# Patient Record
Sex: Female | Born: 1985 | Race: White | Hispanic: No | Marital: Single | Smoking: Current some day smoker
Health system: Southern US, Community
[De-identification: ages and names within clinical notes are randomized; demographics above are authoritative.]

## PROBLEM LIST (undated history)

## (undated) DIAGNOSIS — T7421XA Adult sexual abuse, confirmed, initial encounter: Secondary | ICD-10-CM

## (undated) DIAGNOSIS — K589 Irritable bowel syndrome without diarrhea: Secondary | ICD-10-CM

## (undated) DIAGNOSIS — R87619 Unspecified abnormal cytological findings in specimens from cervix uteri: Secondary | ICD-10-CM

## (undated) DIAGNOSIS — N750 Cyst of Bartholin's gland: Secondary | ICD-10-CM

## (undated) DIAGNOSIS — IMO0002 Reserved for concepts with insufficient information to code with codable children: Secondary | ICD-10-CM

## (undated) DIAGNOSIS — B977 Papillomavirus as the cause of diseases classified elsewhere: Secondary | ICD-10-CM

## (undated) DIAGNOSIS — D649 Anemia, unspecified: Secondary | ICD-10-CM

## (undated) DIAGNOSIS — R51 Headache: Secondary | ICD-10-CM

## (undated) DIAGNOSIS — F909 Attention-deficit hyperactivity disorder, unspecified type: Secondary | ICD-10-CM

## (undated) DIAGNOSIS — F419 Anxiety disorder, unspecified: Secondary | ICD-10-CM

## (undated) DIAGNOSIS — R102 Pelvic and perineal pain: Secondary | ICD-10-CM

## (undated) HISTORY — PX: ABDOMINAL HYSTERECTOMY: SHX81

## (undated) HISTORY — DX: Cyst of Bartholin's gland: N75.0

## (undated) HISTORY — PX: WISDOM TOOTH EXTRACTION: SHX21

## (undated) HISTORY — PX: TONSILLECTOMY: SUR1361

## (undated) HISTORY — DX: Pelvic and perineal pain: R10.2

---
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
## 2003-01-02 ENCOUNTER — Other Ambulatory Visit: Admission: RE | Admit: 2003-01-02 | Discharge: 2003-01-02 | Payer: Self-pay | Admitting: Obstetrics and Gynecology

## 2003-02-15 ENCOUNTER — Emergency Department (HOSPITAL_COMMUNITY): Admission: EM | Admit: 2003-02-15 | Discharge: 2003-02-15 | Payer: Self-pay | Admitting: Emergency Medicine

## 2003-02-15 ENCOUNTER — Encounter: Payer: Self-pay | Admitting: Emergency Medicine

## 2004-02-16 ENCOUNTER — Inpatient Hospital Stay (HOSPITAL_COMMUNITY): Admission: AD | Admit: 2004-02-16 | Discharge: 2004-02-16 | Payer: Self-pay | Admitting: *Deleted

## 2004-02-27 ENCOUNTER — Inpatient Hospital Stay (HOSPITAL_COMMUNITY): Admission: AD | Admit: 2004-02-27 | Discharge: 2004-02-27 | Payer: Self-pay | Admitting: Obstetrics

## 2004-03-10 ENCOUNTER — Inpatient Hospital Stay (HOSPITAL_COMMUNITY): Admission: AD | Admit: 2004-03-10 | Discharge: 2004-03-10 | Payer: Self-pay | Admitting: Obstetrics & Gynecology

## 2004-03-16 ENCOUNTER — Inpatient Hospital Stay (HOSPITAL_COMMUNITY): Admission: AD | Admit: 2004-03-16 | Discharge: 2004-03-19 | Payer: Self-pay | Admitting: Obstetrics & Gynecology

## 2004-07-28 ENCOUNTER — Emergency Department (HOSPITAL_COMMUNITY): Admission: EM | Admit: 2004-07-28 | Discharge: 2004-07-28 | Payer: Self-pay | Admitting: Emergency Medicine

## 2005-04-15 ENCOUNTER — Emergency Department (HOSPITAL_COMMUNITY): Admission: EM | Admit: 2005-04-15 | Discharge: 2005-04-15 | Payer: Self-pay | Admitting: Emergency Medicine

## 2005-06-06 ENCOUNTER — Emergency Department (HOSPITAL_COMMUNITY): Admission: EM | Admit: 2005-06-06 | Discharge: 2005-06-06 | Payer: Self-pay | Admitting: Emergency Medicine

## 2005-11-28 HISTORY — PX: TUBAL LIGATION: SHX77

## 2006-02-15 ENCOUNTER — Ambulatory Visit (HOSPITAL_COMMUNITY): Admission: RE | Admit: 2006-02-15 | Discharge: 2006-02-15 | Payer: Self-pay | Admitting: Obstetrics & Gynecology

## 2006-03-21 ENCOUNTER — Ambulatory Visit (HOSPITAL_COMMUNITY): Admission: RE | Admit: 2006-03-21 | Discharge: 2006-03-21 | Payer: Self-pay | Admitting: Obstetrics & Gynecology

## 2006-05-18 ENCOUNTER — Ambulatory Visit (HOSPITAL_COMMUNITY): Admission: RE | Admit: 2006-05-18 | Discharge: 2006-05-18 | Payer: Self-pay | Admitting: Obstetrics & Gynecology

## 2006-06-09 ENCOUNTER — Inpatient Hospital Stay (HOSPITAL_COMMUNITY): Admission: AD | Admit: 2006-06-09 | Discharge: 2006-06-09 | Payer: Self-pay | Admitting: Obstetrics & Gynecology

## 2006-06-10 ENCOUNTER — Inpatient Hospital Stay (HOSPITAL_COMMUNITY): Admission: AD | Admit: 2006-06-10 | Discharge: 2006-06-10 | Payer: Self-pay | Admitting: Obstetrics & Gynecology

## 2006-07-21 ENCOUNTER — Inpatient Hospital Stay (HOSPITAL_COMMUNITY): Admission: AD | Admit: 2006-07-21 | Discharge: 2006-07-21 | Payer: Self-pay | Admitting: Obstetrics & Gynecology

## 2006-07-24 ENCOUNTER — Ambulatory Visit (HOSPITAL_COMMUNITY): Admission: RE | Admit: 2006-07-24 | Discharge: 2006-07-24 | Payer: Self-pay | Admitting: Obstetrics & Gynecology

## 2006-09-01 ENCOUNTER — Inpatient Hospital Stay (HOSPITAL_COMMUNITY): Admission: AD | Admit: 2006-09-01 | Discharge: 2006-09-01 | Payer: Self-pay | Admitting: Obstetrics & Gynecology

## 2006-09-02 ENCOUNTER — Inpatient Hospital Stay (HOSPITAL_COMMUNITY): Admission: AD | Admit: 2006-09-02 | Discharge: 2006-09-02 | Payer: Self-pay | Admitting: Obstetrics & Gynecology

## 2006-09-19 ENCOUNTER — Observation Stay (HOSPITAL_COMMUNITY): Admission: AD | Admit: 2006-09-19 | Discharge: 2006-09-19 | Payer: Self-pay | Admitting: Obstetrics

## 2006-10-02 ENCOUNTER — Inpatient Hospital Stay (HOSPITAL_COMMUNITY): Admission: AD | Admit: 2006-10-02 | Discharge: 2006-10-04 | Payer: Self-pay | Admitting: Obstetrics & Gynecology

## 2006-10-03 ENCOUNTER — Encounter (INDEPENDENT_AMBULATORY_CARE_PROVIDER_SITE_OTHER): Payer: Self-pay | Admitting: Specialist

## 2007-04-24 ENCOUNTER — Ambulatory Visit: Payer: Self-pay | Admitting: Family Medicine

## 2007-05-07 ENCOUNTER — Emergency Department (HOSPITAL_COMMUNITY): Admission: EM | Admit: 2007-05-07 | Discharge: 2007-05-07 | Payer: Self-pay | Admitting: *Deleted

## 2007-07-10 ENCOUNTER — Ambulatory Visit: Payer: Self-pay | Admitting: Family Medicine

## 2007-12-28 ENCOUNTER — Emergency Department (HOSPITAL_COMMUNITY): Admission: EM | Admit: 2007-12-28 | Discharge: 2007-12-28 | Payer: Self-pay | Admitting: Family Medicine

## 2008-08-24 ENCOUNTER — Emergency Department (HOSPITAL_COMMUNITY): Admission: EM | Admit: 2008-08-24 | Discharge: 2008-08-24 | Payer: Self-pay | Admitting: Emergency Medicine

## 2010-02-10 ENCOUNTER — Emergency Department (HOSPITAL_COMMUNITY): Admission: EM | Admit: 2010-02-10 | Discharge: 2010-02-10 | Payer: Self-pay | Admitting: Emergency Medicine

## 2010-06-08 ENCOUNTER — Observation Stay (HOSPITAL_COMMUNITY): Admission: EM | Admit: 2010-06-08 | Discharge: 2010-06-10 | Payer: Self-pay | Admitting: Emergency Medicine

## 2010-08-26 ENCOUNTER — Emergency Department (HOSPITAL_COMMUNITY): Admission: EM | Admit: 2010-08-26 | Discharge: 2010-08-26 | Payer: Self-pay | Admitting: Emergency Medicine

## 2010-09-07 ENCOUNTER — Emergency Department (HOSPITAL_COMMUNITY): Admission: EM | Admit: 2010-09-07 | Discharge: 2010-09-07 | Payer: Self-pay | Admitting: Emergency Medicine

## 2010-09-10 ENCOUNTER — Inpatient Hospital Stay (HOSPITAL_COMMUNITY): Admission: AD | Admit: 2010-09-10 | Discharge: 2010-09-10 | Payer: Self-pay | Admitting: Obstetrics and Gynecology

## 2010-09-10 ENCOUNTER — Ambulatory Visit: Payer: Self-pay | Admitting: Obstetrics and Gynecology

## 2010-09-30 ENCOUNTER — Ambulatory Visit: Payer: Self-pay | Admitting: Obstetrics & Gynecology

## 2010-11-04 ENCOUNTER — Emergency Department (HOSPITAL_COMMUNITY): Admission: EM | Admit: 2010-11-04 | Discharge: 2010-02-12 | Payer: Self-pay | Admitting: Emergency Medicine

## 2010-11-09 ENCOUNTER — Inpatient Hospital Stay (HOSPITAL_COMMUNITY)
Admission: AD | Admit: 2010-11-09 | Discharge: 2010-11-09 | Payer: Self-pay | Source: Home / Self Care | Attending: Obstetrics and Gynecology | Admitting: Obstetrics and Gynecology

## 2010-11-15 ENCOUNTER — Ambulatory Visit: Payer: Self-pay | Admitting: Obstetrics & Gynecology

## 2010-11-15 ENCOUNTER — Encounter: Payer: Self-pay | Admitting: Obstetrics & Gynecology

## 2010-11-28 HISTORY — PX: BARTHOLIN CYST MARSUPIALIZATION: SHX5383

## 2010-12-18 ENCOUNTER — Encounter: Payer: Self-pay | Admitting: *Deleted

## 2010-12-18 ENCOUNTER — Encounter: Payer: Self-pay | Admitting: Obstetrics & Gynecology

## 2010-12-23 ENCOUNTER — Ambulatory Visit: Admit: 2010-12-23 | Payer: Self-pay | Admitting: Obstetrics & Gynecology

## 2010-12-23 ENCOUNTER — Ambulatory Visit (HOSPITAL_COMMUNITY)
Admission: RE | Admit: 2010-12-23 | Discharge: 2010-12-23 | Payer: Self-pay | Source: Home / Self Care | Attending: Obstetrics & Gynecology | Admitting: Obstetrics & Gynecology

## 2010-12-23 LAB — CBC
HCT: 44.2 % (ref 36.0–46.0)
MCH: 27 pg (ref 26.0–34.0)
Platelets: 183 10*3/uL (ref 150–400)
RBC: 5.38 MIL/uL — ABNORMAL HIGH (ref 3.87–5.11)
WBC: 8 10*3/uL (ref 4.0–10.5)

## 2010-12-31 ENCOUNTER — Emergency Department (HOSPITAL_BASED_OUTPATIENT_CLINIC_OR_DEPARTMENT_OTHER)
Admission: EM | Admit: 2010-12-31 | Discharge: 2010-12-31 | Disposition: A | Discharge status: Home / Self Care | Payer: Self-pay | Attending: Emergency Medicine | Admitting: Emergency Medicine

## 2010-12-31 DIAGNOSIS — J329 Chronic sinusitis, unspecified: Secondary | ICD-10-CM | POA: Insufficient documentation

## 2010-12-31 DIAGNOSIS — F17200 Nicotine dependence, unspecified, uncomplicated: Secondary | ICD-10-CM | POA: Insufficient documentation

## 2011-01-13 NOTE — Op Note (Signed)
  Katelyn [name], Katelyn [name]            ACCOUNT NO.:  [id number]  MEDICAL RECORD NO.:  [id number]          PATIENT TYPE:  AMB  LOCATION:  SDC                           FACILITY:  [facility]  PHYSICIAN:  Allie Bossier, MD        DATE OF BIRTH:  11-Apr-1986  DATE OF PROCEDURE:  12/23/2010 DATE OF DISCHARGE:  12/23/2010                              OPERATIVE REPORT   PREOPERATIVE DIAGNOSIS:  Recurrent left Bartholin gland abscess.  POSTOPERATIVE DIAGNOSIS:  Recurrent left Bartholin gland abscess.  PROCEDURE:  Marsupialization of left Bartholin gland.  SURGEON:  Allie Bossier, MD  ANESTHESIA:  LMA, Brayton Caves, MD, and local (10 mL of 0.5% Marcaine).  DETAILED PROCEDURE AND FINDINGS:  The risks, benefits, and alternatives of surgery were explained, understood, and accepted.  Consents were signed.  I gave her a prescription for Percocet #20 to be used if she goes home (at her request).  In the operating room, general anesthesia was applied.  She was placed in dorsal lithotomy position.  Her vagina was prepped and draped in usual sterile fashion.  The bowel was inspected.  There was approximately 4-cm left Bartholin cyst noted as was seen on preop exam.  Grasped the vulva with Allis clamps and made an incision in the inner portion of the vulva connecting with the cyst.  A moderate amount of cystic fluid that appeared to be noninfected at this point drained.  The cyst wall was grasped and interrupted sutures connected the cyst wall with the vulvar epithelium.  Excellent hemostasis was maintained throughout.  At the end of the case, I injected a total of 10 mL of 0.5% Marcaine in the area for postop pain relief.  She was taken to the recovery room in stable condition. Instrument, sponge, and needle counts were correct.     Allie Bossier, MD     MCD/MEDQ  D:  12/23/2010  T:  12/24/2010  Job:  [id number]  Electronically Signed by Nicholaus Bloom MD on 01/13/2011 01:32:42 PM

## 2011-01-20 ENCOUNTER — Encounter: Payer: Self-pay | Admitting: Obstetrics and Gynecology

## 2011-02-10 LAB — GC/CHLAMYDIA PROBE AMP, GENITAL: GC Probe Amp, Genital: NEGATIVE

## 2011-02-10 LAB — WET PREP, GENITAL: Clue Cells Wet Prep HPF POC: NONE SEEN

## 2011-02-13 LAB — URINE MICROSCOPIC-ADD ON

## 2011-02-13 LAB — URINALYSIS, ROUTINE W REFLEX MICROSCOPIC
Bilirubin Urine: NEGATIVE
Glucose, UA: NEGATIVE mg/dL
Ketones, ur: NEGATIVE mg/dL
Protein, ur: NEGATIVE mg/dL
Specific Gravity, Urine: 1.01 (ref 1.005–1.030)
Urobilinogen, UA: 0.2 mg/dL (ref 0.0–1.0)
pH: 6 (ref 5.0–8.0)

## 2011-02-13 LAB — WET PREP, GENITAL
Clue Cells Wet Prep HPF POC: NONE SEEN
Trich, Wet Prep: NONE SEEN
Yeast Wet Prep HPF POC: NONE SEEN

## 2011-02-13 LAB — BASIC METABOLIC PANEL
BUN: 10 mg/dL (ref 6–23)
CO2: 24 mEq/L (ref 19–32)
Calcium: 8.9 mg/dL (ref 8.4–10.5)
Chloride: 108 mEq/L (ref 96–112)
Creatinine, Ser: 0.58 mg/dL (ref 0.4–1.2)
Creatinine, Ser: 0.73 mg/dL (ref 0.4–1.2)
GFR calc Af Amer: >60 mL/min (ref >60)
Potassium: 3.5 mEq/L (ref 3.5–5.1)
Sodium: 140 mEq/L (ref 135–145)
Sodium: 141 mEq/L (ref 135–145)

## 2011-02-13 LAB — URINE CULTURE: Colony Count: >=100000

## 2011-02-13 LAB — BASIC METABOLIC PANEL WITH GFR
Calcium: 9.2 mg/dL (ref 8.4–10.5)
Chloride: 109 meq/L (ref 96–112)
GFR calc Af Amer: >60 mL/min (ref >60)
GFR calc non Af Amer: >60 mL/min (ref >60)
Glucose, Bld: 78 mg/dL (ref 70–99)

## 2011-02-13 LAB — CBC
HCT: 35.4 % — ABNORMAL LOW (ref 36.0–46.0)
HCT: 45.7 % (ref 36.0–46.0)
Hemoglobin: 11.6 g/dL — ABNORMAL LOW (ref 12.0–15.0)
Hemoglobin: 12.1 g/dL (ref 12.0–15.0)
Hemoglobin: 14.8 g/dL (ref 12.0–15.0)
MCH: 28 pg (ref 26.0–34.0)
MCHC: 32.5 g/dL (ref 30.0–36.0)
MCHC: 32.6 g/dL (ref 30.0–36.0)
MCV: 85.9 fL (ref 78.0–100.0)
MCV: 86 fL (ref 78.0–100.0)
Platelets: 132 10*3/uL — ABNORMAL LOW (ref 150–400)
Platelets: 151 10*3/uL (ref 150–400)
RBC: 4.33 MIL/uL (ref 3.87–5.11)
RBC: 5.31 MIL/uL — ABNORMAL HIGH (ref 3.87–5.11)
RDW: 13.2 % (ref 11.5–15.5)
RDW: 13.4 % (ref 11.5–15.5)
WBC: 17.4 10*3/uL — ABNORMAL HIGH (ref 4.0–10.5)
WBC: 6 10*3/uL (ref 4.0–10.5)

## 2011-02-13 LAB — DIFFERENTIAL
Basophils Absolute: 0.1 10*3/uL (ref 0.0–0.1)
Basophils Relative: 0 % (ref 0–1)
Eosinophils Absolute: 0.1 K/uL (ref 0.0–0.7)
Eosinophils Relative: 1 % (ref 0–5)
Lymphocytes Relative: 17 % (ref 12–46)
Lymphs Abs: 2.9 10*3/uL (ref 0.7–4.0)
Monocytes Absolute: 0.9 K/uL (ref 0.1–1.0)
Monocytes Relative: 5 % (ref 3–12)
Neutro Abs: 13.3 10*3/uL — ABNORMAL HIGH (ref 1.7–7.7)
Neutrophils Relative %: 77 % (ref 43–77)

## 2011-02-13 LAB — COMPREHENSIVE METABOLIC PANEL
ALT: 11 U/L (ref 0–35)
Alkaline Phosphatase: 44 U/L (ref 39–117)
BUN: 6 mg/dL (ref 6–23)
CO2: 27 mEq/L (ref 19–32)
Calcium: 7.9 mg/dL — ABNORMAL LOW (ref 8.4–10.5)
GFR calc non Af Amer: >60 mL/min (ref >60)
Glucose, Bld: 91 mg/dL (ref 70–99)
Sodium: 140 mEq/L (ref 135–145)
Total Protein: 5.2 g/dL — ABNORMAL LOW (ref 6.0–8.3)

## 2011-02-13 LAB — GC/CHLAMYDIA PROBE AMP, GENITAL
Chlamydia, DNA Probe: NEGATIVE
GC Probe Amp, Genital: NEGATIVE

## 2011-02-13 LAB — HEPATIC FUNCTION PANEL
ALT: 15 U/L (ref 0–35)
AST: 22 U/L (ref 0–37)
Albumin: 4.4 g/dL (ref 3.5–5.2)
Alkaline Phosphatase: 61 U/L (ref 39–117)
Bilirubin, Direct: 0.1 mg/dL (ref 0.0–0.3)
Indirect Bilirubin: 0.6 mg/dL (ref 0.3–0.9)
Total Bilirubin: 0.7 mg/dL (ref 0.3–1.2)
Total Protein: 7.1 g/dL (ref 6.0–8.3)

## 2011-02-18 LAB — POCT I-STAT, CHEM 8
BUN: 7 mg/dL (ref 6–23)
Creatinine, Ser: 1.2 mg/dL (ref 0.4–1.2)
Glucose, Bld: 82 mg/dL (ref 70–99)
Sodium: 138 mEq/L (ref 135–145)
TCO2: 27 mmol/L (ref 0–100)

## 2011-02-18 LAB — DIFFERENTIAL
Eosinophils Relative: 1 % (ref 0–5)
Lymphocytes Relative: 18 % (ref 12–46)
Lymphs Abs: 2.1 10*3/uL (ref 0.7–4.0)
Monocytes Relative: 8 % (ref 3–12)

## 2011-02-18 LAB — CBC
HCT: 36.8 % (ref 36.0–46.0)
Hemoglobin: 12.4 g/dL (ref 12.0–15.0)
MCV: 85.3 fL (ref 78.0–100.0)
RBC: 4.32 MIL/uL (ref 3.87–5.11)
WBC: 11.4 10*3/uL — ABNORMAL HIGH (ref 4.0–10.5)

## 2011-02-18 LAB — URINE MICROSCOPIC-ADD ON

## 2011-02-18 LAB — URINE CULTURE

## 2011-02-18 LAB — URINALYSIS, ROUTINE W REFLEX MICROSCOPIC
Glucose, UA: NEGATIVE mg/dL
Nitrite: POSITIVE — AB
Specific Gravity, Urine: 1.008 (ref 1.005–1.030)
pH: 6 (ref 5.0–8.0)

## 2011-02-18 LAB — CK: Total CK: 77 U/L (ref 7–177)

## 2011-02-21 ENCOUNTER — Encounter: Payer: Self-pay | Admitting: Obstetrics and Gynecology

## 2011-03-05 ENCOUNTER — Inpatient Hospital Stay (HOSPITAL_COMMUNITY)
Admission: AD | Admit: 2011-03-05 | Discharge: 2011-03-05 | Disposition: A | Discharge status: Home / Self Care | Payer: Self-pay | Source: Ambulatory Visit | Attending: Obstetrics & Gynecology | Admitting: Obstetrics & Gynecology

## 2011-03-05 ENCOUNTER — Inpatient Hospital Stay (HOSPITAL_COMMUNITY): Payer: Self-pay

## 2011-03-05 DIAGNOSIS — N946 Dysmenorrhea, unspecified: Secondary | ICD-10-CM | POA: Insufficient documentation

## 2011-03-05 DIAGNOSIS — R112 Nausea with vomiting, unspecified: Secondary | ICD-10-CM | POA: Insufficient documentation

## 2011-03-05 LAB — URINALYSIS, ROUTINE W REFLEX MICROSCOPIC
Glucose, UA: NEGATIVE mg/dL
Hgb urine dipstick: NEGATIVE
Specific Gravity, Urine: <1.005 — ABNORMAL LOW (ref 1.005–1.030)

## 2011-03-05 LAB — COMPREHENSIVE METABOLIC PANEL
ALT: 12 U/L (ref 0–35)
AST: 23 U/L (ref 0–37)
Calcium: 9.3 mg/dL (ref 8.4–10.5)
GFR calc Af Amer: >60 mL/min (ref >60)
Sodium: 140 mEq/L (ref 135–145)
Total Protein: 6.8 g/dL (ref 6.0–8.3)

## 2011-03-05 LAB — POCT PREGNANCY, URINE: Preg Test, Ur: NEGATIVE

## 2011-03-05 LAB — URINE MICROSCOPIC-ADD ON

## 2011-03-05 LAB — WET PREP, GENITAL: Yeast Wet Prep HPF POC: NONE SEEN

## 2011-03-05 LAB — CBC
HCT: 42.6 % (ref 36.0–46.0)
Hemoglobin: 13.9 g/dL (ref 12.0–15.0)
MCHC: 32.6 g/dL (ref 30.0–36.0)
WBC: 8.3 10*3/uL (ref 4.0–10.5)

## 2011-03-07 LAB — GC/CHLAMYDIA PROBE AMP, GENITAL: GC Probe Amp, Genital: NEGATIVE

## 2011-03-08 ENCOUNTER — Emergency Department (HOSPITAL_COMMUNITY)
Admission: EM | Admit: 2011-03-08 | Discharge: 2011-03-08 | Discharge status: Against Medical Advice | Payer: Self-pay | Attending: Emergency Medicine | Admitting: Emergency Medicine

## 2011-03-08 DIAGNOSIS — Z0389 Encounter for observation for other suspected diseases and conditions ruled out: Secondary | ICD-10-CM | POA: Insufficient documentation

## 2011-03-08 LAB — POCT I-STAT, CHEM 8
Calcium, Ion: 1.24 mmol/L (ref 1.12–1.32)
Chloride: 105 mEq/L (ref 96–112)
HCT: 44 % (ref 36.0–46.0)
Sodium: 143 mEq/L (ref 135–145)
TCO2: 27 mmol/L (ref 0–100)

## 2011-03-08 LAB — DIFFERENTIAL
Basophils Absolute: 0 10*3/uL (ref 0.0–0.1)
Basophils Relative: 0 % (ref 0–1)
Monocytes Absolute: 0.4 10*3/uL (ref 0.1–1.0)
Neutro Abs: 5.6 10*3/uL (ref 1.7–7.7)
Neutrophils Relative %: 62 % (ref 43–77)

## 2011-03-08 LAB — CBC
MCV: 85.3 fL (ref 78.0–100.0)
Platelets: 180 10*3/uL (ref 150–400)
RDW: 13.9 % (ref 11.5–15.5)
WBC: 9.2 10*3/uL (ref 4.0–10.5)

## 2011-03-10 ENCOUNTER — Ambulatory Visit (INDEPENDENT_AMBULATORY_CARE_PROVIDER_SITE_OTHER): Payer: Self-pay | Admitting: Family

## 2011-03-10 DIAGNOSIS — N946 Dysmenorrhea, unspecified: Secondary | ICD-10-CM

## 2011-03-10 DIAGNOSIS — N949 Unspecified condition associated with female genital organs and menstrual cycle: Secondary | ICD-10-CM

## 2011-03-10 DIAGNOSIS — Z3009 Encounter for other general counseling and advice on contraception: Secondary | ICD-10-CM

## 2011-03-11 NOTE — Progress Notes (Signed)
NAME:  Katelyn [name], Katelyn [name]            ACCOUNT NO.:  [id number]  MEDICAL RECORD NO.:  [id number]           PATIENT TYPE:  LOCATION:  [facility]                     FACILITY:  PHYSICIAN:  Jaynie Collins, MD     DATE OF BIRTH:  11/07/1986  DATE OF SERVICE:  03/10/2011                                 CLINIC NOTE  Ms. Ploch is a 25 year old, gravida 2, para 2 who is here today complaining of nausea, vomiting, and pain with her period.  She says that her last menstrual period started on March 02, 2011, and since then she has been having increasing nausea, vomiting, and pain and also back pain that reminds her of labor pain.  The patient is very concerned about this.  She is concerned that she might have endometriosis because her aunt has endometriosis and told her that this is something that causes the similar symptoms.  She said since she had a tubal ligation 4 years ago her dysmenorrhea has gotten increasingly worse and now it is associated with nausea, vomiting, and excruciating pain.  She denies any other symptoms at this point.  She denies any gastrointestinal symptoms other than nausea and vomiting and no genitourinary symptoms.  PHYSICAL EXAMINATION:  VITAL SIGNS:  The patient is afebrile.  Vital signs are stable. ABDOMEN:  The patient has mild tenderness in the lower abdomen.  No rebound or guarding.  No organomegaly. PELVIC:  The patient does have cervical motion tenderness, uterine tenderness on palpation.  Normal uterine size.  Normal adnexa bilaterally.  The patient did have a pelvic ultrasound on March 06, 2011, which showed a 7-week sized uterus.  No fibroids or uterine masses.  Normal adnexa bilaterally.  ASSESSMENT/PLAN:  The patient is a 25 year old, gravida 2, para 2 here with pelvic pain and dysmenorrhea.  On examination, the patient does have cervical motion tenderness and uterine tenderness.  As such, there is a concern for pelvic inflammatory disease and she will be  treated with ceftriaxone 250 mg IM x1 and sent home with a prescription for doxycycline 100 mg b.i.d. for 14 days.  The patient was also told that if her pelvic pain persists, that she could be evaluated for endometriosis but in the meantime we could presumptively treat for endometriosis and also treat her dysmenorrhea by putting her oral contraceptive pills.  The patient is agreeable to this plan.  She was given 2 sample packs of Loestrin 24 Fe to see if her symptoms will get better on this medication.  The patient was also given a prescription for diclofenac DR and told to take as instructed.  She was told to call or come back in if her pain worsens or for any other gynecologic symptoms.  The patient is up to date with her Pap smear which was done on November 14, 2011.          ______________________________ Jaynie Collins, MD    UA/MEDQ  D:  03/10/2011  T:  03/11/2011  Job:  [id number]
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
## 2011-04-15 NOTE — Op Note (Signed)
[name], [name]            ACCOUNT NO.:  [id number]   MEDICAL RECORD NO.:  [id number]          PATIENT TYPE:  INP   LOCATION:  9130                          FACILITY:  [facility]   PHYSICIAN:  Roseanna Rainbow, M.D.DATE OF BIRTH:  [birth year]   DATE OF PROCEDURE:  10/03/2006  DATE OF DISCHARGE:                                 OPERATIVE REPORT   PREOPERATIVE DIAGNOSIS:  Multiparity, desires sterilization procedure.   POSTOPERATIVE DIAGNOSIS:  Multiparity, desires sterilization procedure.   PROCEDURE:  Modified Pomeroy bilateral tubal ligation.   SURGEON:  Roseanna Rainbow, M.D.   ANESTHESIA:  General.   COMPLICATIONS:  None.   IV FLUIDS:  As per anesthesia.   URINE OUTPUT:  300 mL clear urine at the beginning of the procedure.   PATHOLOGY:  Portions of fallopian tubes.   DESCRIPTION OF PROCEDURE:  The patient was taken to the operating room with  an IV running.  She was given general anesthesia and prepped and draped in  the usual sterile fashion.  An infraumbilical skin incision was made with  the scalpel and carried down to the fascia.  The fascia was tented up and  entered sharply.  The parietal peritoneum was tented up and entered sharply,  as well.  [employer] retractors were then placed into the incision.  The  right fallopian tube was then grasped with a Babcock clamp and followed out  to the fimbriated end.  The tube was re-grasped in the mid isthmic portion  of the tube.  The 2 cm segment of tube was doubly ligated with ties of 0  plain and excised.  Adequate hemostasis was noted.  The tube was returned to  the abdomen.  The left fallopian tube was manipulated in a similar fashion.  The fascia and parietal peritoneum were reapproximated in a running fashion  using 0 Vicryl.  The skin was closed in a subcuticular fashion using 3-0  Vicryl.  At the close of the procedure, the instrument and pack counts were  said to be correct x2.  The patient is taken to the  PACU awake and in stable  condition.      Roseanna Rainbow, M.D.  Electronically Signed     LAJ/MEDQ  D:  10/03/2006  T:  10/03/2006  Job:  [id number]

## 2011-04-15 NOTE — H&P (Signed)
NAMEJASMAINE, Katelyn [name]            ACCOUNT NO.:  [id number]   MEDICAL RECORD NO.:  [id number]          PATIENT TYPE:  INP   LOCATION:  9161                          [facility]ACILITY:  [facility]   PHYSICIAN:  Roseanna Rainbow, M.D.DATE OF BIRTH:  Jun 21, 1986   DATE OF ADMISSION:  10/02/2006  DATE OF DISCHARGE:                                HISTORY & PHYSICAL   CHIEF COMPLAINT:  The patient is a 25 year old para 1 with an estimated date  of confinement of October 10, 2006, with an intrauterine pregnancy at 38+  weeks, complaining of contractions.   HISTORY OF PRESENT ILLNESS:  Please see the above.   ALLERGIES:  Questionable allergy to CEPHALOSPORINS.   MEDICATIONS:  See the medication reconciliation sheet.   OB RISK FACTORS:  Tobacco use.   PRENATAL LABORATORIES:  Hematocrit 33.7, hemoglobin 10.9, platelets 182,000.  Urine culture and sensitivity:  No growth.  GC negative.  One-hour GCT 84.  Hepatitis B surface antigen negative.  HIV nonreactive.  Blood type O  positive, antibody screen negative.  Rubella immune.  Pap smear negative.   PAST GYN HISTORY:  History of abnormal Pap smears, colposcopy.   PAST MEDICAL HISTORY:  1. Iron-deficiency anemia.  2. ADHD.  3. Kidney stones.   PAST SURGICAL HISTORY:  1. Oral surgery.  2. Tonsils and adenoids.   SOCIAL HISTORY:  Unemployed.  Single.  Living with significant other.  Drinks a minimal amount of alcohol.  Currently smokes less than 1/2 pack per  day.  Denies illicit drug use.   FAMILY HISTORY:  Adult onset diabetes.  Alcoholism.   PAST OB HISTORY:  In 2005, she was delivered of a female, full-term vaginal  delivery, 6 pounds 6 ounces.   PHYSICAL EXAMINATION:  VITAL SIGNS:  Temperature 97.4, pulse 98, respiratory  rate 22, blood pressure 125/78.  GENERAL:  Uncomfortable.  PELVIC:  Sterile vaginal exam:  2-3, 60-80%, bulging bag of water with a  contraction.  The membranes were artificially ruptured for clear fluid.  Fetal heart  tracing was reassuring.  Tocodynamometer:  Uterine contractions  every 3-8 minutes.   ASSESSMENT:  Primipara at term, early labor.   PLAN:  Admission.  Expectant management.  Anticipate a vaginal delivery.      Roseanna Rainbow, M.D.  Electronically Signed     LAJ/MEDQ  D:  10/02/2006  T:  10/02/2006  Job:  [id number]
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
## 2011-04-22 ENCOUNTER — Encounter: Payer: Self-pay | Admitting: Obstetrics and Gynecology

## 2011-06-06 ENCOUNTER — Emergency Department (HOSPITAL_COMMUNITY)
Admission: EM | Admit: 2011-06-06 | Discharge: 2011-06-07 | Disposition: A | Discharge status: Home / Self Care | Payer: Medicaid Other | Attending: Emergency Medicine | Admitting: Emergency Medicine

## 2011-06-06 DIAGNOSIS — IMO0001 Reserved for inherently not codable concepts without codable children: Secondary | ICD-10-CM | POA: Insufficient documentation

## 2011-06-06 DIAGNOSIS — R112 Nausea with vomiting, unspecified: Secondary | ICD-10-CM | POA: Insufficient documentation

## 2011-06-06 DIAGNOSIS — S90569A Insect bite (nonvenomous), unspecified ankle, initial encounter: Secondary | ICD-10-CM | POA: Insufficient documentation

## 2011-06-06 DIAGNOSIS — R5381 Other malaise: Secondary | ICD-10-CM | POA: Insufficient documentation

## 2011-06-06 DIAGNOSIS — M79609 Pain in unspecified limb: Secondary | ICD-10-CM | POA: Insufficient documentation

## 2011-06-06 DIAGNOSIS — R197 Diarrhea, unspecified: Secondary | ICD-10-CM | POA: Insufficient documentation

## 2011-06-06 LAB — DIFFERENTIAL
Basophils Absolute: 0 10*3/uL (ref 0.0–0.1)
Basophils Relative: 0 % (ref 0–1)
Neutro Abs: 5.7 10*3/uL (ref 1.7–7.7)
Neutrophils Relative %: 52 % (ref 43–77)

## 2011-06-06 LAB — CBC
Hemoglobin: 13.3 g/dL (ref 12.0–15.0)
RBC: 4.93 MIL/uL (ref 3.87–5.11)

## 2011-06-06 LAB — POCT I-STAT, CHEM 8
BUN: 11 mg/dL (ref 6–23)
Hemoglobin: 15 g/dL (ref 12.0–15.0)
Sodium: 140 mEq/L (ref 135–145)
TCO2: 25 mmol/L (ref 0–100)

## 2011-06-07 ENCOUNTER — Emergency Department (HOSPITAL_COMMUNITY)
Admission: EM | Admit: 2011-06-07 | Discharge: 2011-06-08 | Disposition: A | Discharge status: Home / Self Care | Payer: Medicaid Other | Attending: Emergency Medicine | Admitting: Emergency Medicine

## 2011-06-07 ENCOUNTER — Encounter (HOSPITAL_COMMUNITY): Payer: Self-pay

## 2011-06-07 ENCOUNTER — Emergency Department (HOSPITAL_COMMUNITY): Payer: Medicaid Other

## 2011-06-07 DIAGNOSIS — R109 Unspecified abdominal pain: Secondary | ICD-10-CM | POA: Insufficient documentation

## 2011-06-07 DIAGNOSIS — R110 Nausea: Secondary | ICD-10-CM | POA: Insufficient documentation

## 2011-06-07 DIAGNOSIS — N390 Urinary tract infection, site not specified: Secondary | ICD-10-CM | POA: Insufficient documentation

## 2011-06-07 DIAGNOSIS — R5383 Other fatigue: Secondary | ICD-10-CM | POA: Insufficient documentation

## 2011-06-07 DIAGNOSIS — R5381 Other malaise: Secondary | ICD-10-CM | POA: Insufficient documentation

## 2011-06-07 MED ORDER — IOHEXOL 300 MG/ML  SOLN
100.0000 mL | Freq: Once | INTRAMUSCULAR | Status: AC | PRN
  Administered 2011-06-07: 100 mL via INTRAVENOUS

## 2011-06-08 LAB — CBC
MCHC: 33.5 g/dL (ref 30.0–36.0)
MCV: 83.7 fL (ref 78.0–100.0)
Platelets: 181 10*3/uL (ref 150–400)
RDW: 14.1 % (ref 11.5–15.5)
WBC: 10.2 10*3/uL (ref 4.0–10.5)

## 2011-06-08 LAB — DIFFERENTIAL
Eosinophils Absolute: 0.2 10*3/uL (ref 0.0–0.7)
Eosinophils Relative: 2 % (ref 0–5)
Lymphs Abs: 3.3 10*3/uL (ref 0.7–4.0)

## 2011-06-08 LAB — COMPREHENSIVE METABOLIC PANEL
ALT: 12 U/L (ref 0–35)
Alkaline Phosphatase: 56 U/L (ref 39–117)
CO2: 26 mEq/L (ref 19–32)
GFR calc Af Amer: >60 mL/min (ref >60)
GFR calc non Af Amer: >60 mL/min (ref >60)
Glucose, Bld: 74 mg/dL (ref 70–99)
Potassium: 4.1 mEq/L (ref 3.5–5.1)
Sodium: 138 mEq/L (ref 135–145)
Total Protein: 6.5 g/dL (ref 6.0–8.3)

## 2011-06-08 LAB — URINALYSIS, ROUTINE W REFLEX MICROSCOPIC
Bilirubin Urine: NEGATIVE
Ketones, ur: 15 mg/dL — AB
Nitrite: NEGATIVE
Protein, ur: NEGATIVE mg/dL
Specific Gravity, Urine: 1.013 (ref 1.005–1.030)
Urobilinogen, UA: 0.2 mg/dL (ref 0.0–1.0)

## 2011-06-08 LAB — URINE MICROSCOPIC-ADD ON

## 2011-07-20 ENCOUNTER — Inpatient Hospital Stay (HOSPITAL_COMMUNITY)
Admission: AD | Admit: 2011-07-20 | Discharge: 2011-07-20 | Disposition: A | Discharge status: Home / Self Care | Payer: Medicaid Other | Source: Ambulatory Visit | Attending: Obstetrics & Gynecology | Admitting: Obstetrics & Gynecology

## 2011-07-20 ENCOUNTER — Encounter (HOSPITAL_COMMUNITY): Payer: Self-pay

## 2011-07-20 DIAGNOSIS — R21 Rash and other nonspecific skin eruption: Secondary | ICD-10-CM | POA: Insufficient documentation

## 2011-07-20 DIAGNOSIS — F17200 Nicotine dependence, unspecified, uncomplicated: Secondary | ICD-10-CM | POA: Insufficient documentation

## 2011-07-20 HISTORY — DX: Adult sexual abuse, confirmed, initial encounter: T74.21XA

## 2011-07-20 HISTORY — DX: Anemia, unspecified: D64.9

## 2011-07-20 HISTORY — DX: Reserved for concepts with insufficient information to code with codable children: IMO0002

## 2011-07-20 HISTORY — DX: Papillomavirus as the cause of diseases classified elsewhere: B97.7

## 2011-07-20 HISTORY — DX: Unspecified abnormal cytological findings in specimens from cervix uteri: R87.619

## 2011-07-20 LAB — WET PREP, GENITAL
Clue Cells Wet Prep HPF POC: NONE SEEN
Trich, Wet Prep: NONE SEEN
Yeast Wet Prep HPF POC: NONE SEEN

## 2011-07-20 MED ORDER — NYSTATIN-TRIAMCINOLONE 100000-0.1 UNIT/GM-% EX CREA: TOPICAL_CREAM | CUTANEOUS | Status: DC

## 2011-07-20 MED ORDER — NAPROXEN SODIUM 550 MG PO TABS: 550.0000 mg | ORAL_TABLET | Freq: Two times a day (BID) | ORAL | Status: DC
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
## 2011-07-20 NOTE — Progress Notes (Signed)
Pt in c/o vaginal rash x3 weeks, states she believed it was a yeast infxn at first, tried monistat, did not help and has continued to get worse.  States it has now spread down thighs, states rash on vagina is open sores and is raw from scratching.  Denies any hx of herpes.  Has tried multiple creams, nothing has helped.

## 2011-07-21 LAB — GC/CHLAMYDIA PROBE AMP, GENITAL: GC Probe Amp, Genital: NEGATIVE

## 2011-07-26 ENCOUNTER — Inpatient Hospital Stay (HOSPITAL_COMMUNITY)
Admission: AD | Admit: 2011-07-26 | Discharge: 2011-07-27 | Disposition: A | Discharge status: Home / Self Care | Payer: Medicaid Other | Source: Ambulatory Visit | Attending: Family Medicine | Admitting: Family Medicine

## 2011-07-26 ENCOUNTER — Encounter (HOSPITAL_COMMUNITY): Payer: Self-pay | Admitting: *Deleted

## 2011-07-26 DIAGNOSIS — N946 Dysmenorrhea, unspecified: Secondary | ICD-10-CM

## 2011-07-26 DIAGNOSIS — F17200 Nicotine dependence, unspecified, uncomplicated: Secondary | ICD-10-CM | POA: Insufficient documentation

## 2011-07-26 DIAGNOSIS — B977 Papillomavirus as the cause of diseases classified elsewhere: Secondary | ICD-10-CM | POA: Insufficient documentation

## 2011-07-26 LAB — URINE MICROSCOPIC-ADD ON

## 2011-07-26 LAB — URINALYSIS, ROUTINE W REFLEX MICROSCOPIC
Bilirubin Urine: NEGATIVE
Glucose, UA: NEGATIVE mg/dL
Hgb urine dipstick: NEGATIVE
Ketones, ur: NEGATIVE mg/dL
Protein, ur: NEGATIVE mg/dL
pH: 7 (ref 5.0–8.0)

## 2011-07-26 LAB — CBC
HCT: 36.6 % (ref 36.0–46.0)
Hemoglobin: 12 g/dL (ref 12.0–15.0)
MCHC: 32.8 g/dL (ref 30.0–36.0)
WBC: 9.7 10*3/uL (ref 4.0–10.5)

## 2011-07-26 MED ORDER — KETOROLAC TROMETHAMINE 60 MG/2ML IM SOLN
60.0000 mg | Freq: Once | INTRAMUSCULAR | Status: AC
  Administered 2011-07-26: 60 mg via INTRAMUSCULAR
  Filled 2011-07-26: qty 2

## 2011-07-26 MED ORDER — HYDROMORPHONE HCL 1 MG/ML IJ SOLN
2.0000 mg | Freq: Once | INTRAMUSCULAR | Status: AC
  Administered 2011-07-27: 2 mg via INTRAMUSCULAR
  Filled 2011-07-26: qty 2

## 2011-07-26 MED ORDER — ONDANSETRON 8 MG PO TBDP
8.0000 mg | ORAL_TABLET | Freq: Once | ORAL | Status: AC
  Administered 2011-07-26: 8 mg via ORAL
  Filled 2011-07-26: qty 1

## 2011-07-26 NOTE — Progress Notes (Signed)
Pt states, " I started my period this morning, and the pain started again after I bent over to pick something up. It is a burning pain and I feel like I'm on fire inside. I've thrown up five times in the past hour."
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
## 2011-07-27 MED ORDER — KETOROLAC TROMETHAMINE 10 MG PO TABS: 10.0000 mg | ORAL_TABLET | Freq: Four times a day (QID) | ORAL | Status: AC | PRN

## 2011-08-02 NOTE — ED Provider Notes (Signed)
Chart reviewed and agree with management and plan.  

## 2011-08-14 ENCOUNTER — Inpatient Hospital Stay (INDEPENDENT_AMBULATORY_CARE_PROVIDER_SITE_OTHER)
Admission: RE | Admit: 2011-08-14 | Discharge: 2011-08-14 | Disposition: A | Discharge status: Home / Self Care | Payer: Medicaid Other | Source: Ambulatory Visit | Attending: Family Medicine | Admitting: Family Medicine

## 2011-08-14 ENCOUNTER — Ambulatory Visit (INDEPENDENT_AMBULATORY_CARE_PROVIDER_SITE_OTHER): Payer: Medicaid Other

## 2011-08-14 DIAGNOSIS — B9789 Other viral agents as the cause of diseases classified elsewhere: Secondary | ICD-10-CM

## 2011-08-14 LAB — POCT I-STAT, CHEM 8
Chloride: 102 mEq/L (ref 96–112)
Creatinine, Ser: 0.8 mg/dL (ref 0.50–1.10)
Glucose, Bld: 77 mg/dL (ref 70–99)
Potassium: 4 mEq/L (ref 3.5–5.1)

## 2011-08-18 LAB — POCT PREGNANCY, URINE
Operator id: 116391
Preg Test, Ur: NEGATIVE

## 2011-08-18 LAB — POCT URINALYSIS DIP (DEVICE)
Bilirubin Urine: NEGATIVE
Glucose, UA: NEGATIVE
Nitrite: POSITIVE — AB

## 2011-08-26 ENCOUNTER — Emergency Department (HOSPITAL_COMMUNITY)
Admission: EM | Admit: 2011-08-26 | Discharge: 2011-08-26 | Discharge status: Against Medical Advice | Payer: Medicaid Other | Attending: Emergency Medicine | Admitting: Emergency Medicine

## 2011-08-26 DIAGNOSIS — R0989 Other specified symptoms and signs involving the circulatory and respiratory systems: Secondary | ICD-10-CM | POA: Insufficient documentation

## 2011-08-26 DIAGNOSIS — R1110 Vomiting, unspecified: Secondary | ICD-10-CM | POA: Insufficient documentation

## 2011-08-26 DIAGNOSIS — R0609 Other forms of dyspnea: Secondary | ICD-10-CM | POA: Insufficient documentation

## 2011-08-26 DIAGNOSIS — N6459 Other signs and symptoms in breast: Secondary | ICD-10-CM | POA: Insufficient documentation

## 2011-08-27 ENCOUNTER — Emergency Department (HOSPITAL_COMMUNITY)
Admission: EM | Admit: 2011-08-27 | Discharge: 2011-08-27 | Disposition: A | Discharge status: Home / Self Care | Payer: Medicaid Other | Attending: Emergency Medicine | Admitting: Emergency Medicine

## 2011-08-27 ENCOUNTER — Emergency Department (HOSPITAL_COMMUNITY): Payer: Medicaid Other

## 2011-08-27 DIAGNOSIS — R110 Nausea: Secondary | ICD-10-CM | POA: Insufficient documentation

## 2011-08-27 DIAGNOSIS — R079 Chest pain, unspecified: Secondary | ICD-10-CM | POA: Insufficient documentation

## 2011-08-27 DIAGNOSIS — N61 Mastitis without abscess: Secondary | ICD-10-CM | POA: Insufficient documentation

## 2011-08-27 LAB — DIFFERENTIAL
Basophils Absolute: 0 10*3/uL (ref 0.0–0.1)
Basophils Relative: 0 % (ref 0–1)
Neutro Abs: 12.8 10*3/uL — ABNORMAL HIGH (ref 1.7–7.7)
Neutrophils Relative %: 82 % — ABNORMAL HIGH (ref 43–77)

## 2011-08-27 LAB — BASIC METABOLIC PANEL
BUN: 12 mg/dL (ref 6–23)
CO2: 25 mEq/L (ref 19–32)
Chloride: 101 mEq/L (ref 96–112)
Creatinine, Ser: 0.72 mg/dL (ref 0.50–1.10)
GFR calc Af Amer: >60 mL/min (ref >60)
Glucose, Bld: 94 mg/dL (ref 70–99)

## 2011-08-27 LAB — URINALYSIS, ROUTINE W REFLEX MICROSCOPIC
Ketones, ur: 40 mg/dL — AB
Leukocytes, UA: NEGATIVE
Nitrite: POSITIVE — AB
Specific Gravity, Urine: 1.01 (ref 1.005–1.030)
Urobilinogen, UA: 1 mg/dL (ref 0.0–1.0)
pH: 6 (ref 5.0–8.0)

## 2011-08-27 LAB — URINE MICROSCOPIC-ADD ON

## 2011-08-27 LAB — CBC
Hemoglobin: 13.7 g/dL (ref 12.0–15.0)
MCHC: 34.1 g/dL (ref 30.0–36.0)
RBC: 4.88 MIL/uL (ref 3.87–5.11)

## 2011-08-27 LAB — D-DIMER, QUANTITATIVE: D-Dimer, Quant: 0.41 ug/mL-FEU (ref 0.00–0.48)

## 2011-08-29 LAB — URINALYSIS, ROUTINE W REFLEX MICROSCOPIC
Bilirubin Urine: NEGATIVE
Glucose, UA: NEGATIVE
Hgb urine dipstick: NEGATIVE
Specific Gravity, Urine: 1.013
pH: 6.5

## 2011-09-05 DIAGNOSIS — N750 Cyst of Bartholin's gland: Secondary | ICD-10-CM

## 2011-09-07 ENCOUNTER — Ambulatory Visit (INDEPENDENT_AMBULATORY_CARE_PROVIDER_SITE_OTHER): Payer: Medicaid Other | Admitting: Family Medicine

## 2011-09-07 ENCOUNTER — Other Ambulatory Visit (HOSPITAL_COMMUNITY)
Admission: RE | Admit: 2011-09-07 | Discharge: 2011-09-07 | Disposition: A | Discharge status: Home / Self Care | Payer: Medicaid Other | Source: Ambulatory Visit | Attending: Family Medicine | Admitting: Family Medicine

## 2011-09-07 ENCOUNTER — Encounter: Payer: Self-pay | Admitting: Family Medicine

## 2011-09-07 VITALS — BP 120/84 | HR 102 | Temp 98.5°F | Ht 61.0 in | Wt 113.1 lb

## 2011-09-07 DIAGNOSIS — N870 Mild cervical dysplasia: Secondary | ICD-10-CM | POA: Insufficient documentation

## 2011-09-07 DIAGNOSIS — R87611 Atypical squamous cells cannot exclude high grade squamous intraepithelial lesion on cytologic smear of cervix (ASC-H): Secondary | ICD-10-CM

## 2011-09-07 DIAGNOSIS — Z01419 Encounter for gynecological examination (general) (routine) without abnormal findings: Secondary | ICD-10-CM | POA: Insufficient documentation

## 2011-09-07 DIAGNOSIS — IMO0002 Reserved for concepts with insufficient information to code with codable children: Secondary | ICD-10-CM

## 2011-09-07 DIAGNOSIS — Z113 Encounter for screening for infections with a predominantly sexual mode of transmission: Secondary | ICD-10-CM | POA: Insufficient documentation

## 2011-09-07 DIAGNOSIS — R87810 Cervical high risk human papillomavirus (HPV) DNA test positive: Secondary | ICD-10-CM

## 2011-09-07 DIAGNOSIS — Z01812 Encounter for preprocedural laboratory examination: Secondary | ICD-10-CM

## 2011-09-07 MED ORDER — IBUPROFEN 800 MG PO TABS: 800.0000 mg | ORAL_TABLET | Freq: Three times a day (TID) | ORAL | Status: AC

## 2011-09-07 MED ORDER — OXYCODONE-ACETAMINOPHEN 5-325 MG PO TABS: 1.0000 | ORAL_TABLET | ORAL | Status: DC | PRN

## 2011-09-07 NOTE — Patient Instructions (Signed)
For your rash: use Eucerin, Aquaphor or Cetaphil (thick emoilents) at least 2 times a day. Use thick moisturizing body washes and "dry skin" shaving creams when shaving.    Colposcopy Care After Colposcopy is a procedure in which a special tool is used to magnify the surface of the cervix. A tissue sample (biopsy) may also be taken. This sample will be used to detect cancer of the cervix or other problems. These procedures may be done after a pelvic exam shows a possible problem.  AFTER THE TEST  You may have some cramping.   Lie down for a few minutes if you feel light headed.   You may have some bleeding which should stop in a few days.  HOME CARE  Do not have sex or use tampons for 2-3 days or as directed.   Only take medicine as directed by your doctor.   If you are on birth control pills, continue to take your pills the way you normally do.   To protect your privacy, test results cannot be given over the phone. Make sure you get the results of your test. Ask how you will get the results if you have not been told. Do not assume everything is normal if you have not heard from your doctor. Follow up on all of your test results.  GET HELP RIGHT AWAY IF YOU:  Are bleeding heavily or passing blood clots.   Develop a fever of 102F (38.9C) or higher.   Have abnormal vaginal discharge.   Have cramps that do not go away after medicine.   Feel light headed, dizzy or faint.  MAKE SURE YOU:   Understand these instructions.   Will watch your condition.   Will get help right away if you are not doing well or get worse.  Document Released: 05/02/2008 Document Re-Released: 09/11/2009 [facility] Patient Information 2011 [place], [place].
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
## 2011-10-05 ENCOUNTER — Encounter: Payer: Self-pay | Admitting: Family Medicine

## 2011-10-05 ENCOUNTER — Ambulatory Visit (INDEPENDENT_AMBULATORY_CARE_PROVIDER_SITE_OTHER): Payer: Medicaid Other | Admitting: Family Medicine

## 2011-10-05 VITALS — BP 120/80 | HR 83 | Temp 98.7°F | Ht 61.0 in | Wt 113.5 lb

## 2011-10-05 DIAGNOSIS — N949 Unspecified condition associated with female genital organs and menstrual cycle: Secondary | ICD-10-CM

## 2011-10-05 DIAGNOSIS — N750 Cyst of Bartholin's gland: Secondary | ICD-10-CM

## 2011-10-05 DIAGNOSIS — R102 Pelvic and perineal pain: Secondary | ICD-10-CM

## 2011-10-05 DIAGNOSIS — N870 Mild cervical dysplasia: Secondary | ICD-10-CM

## 2011-10-05 MED ORDER — TRAMADOL HCL 50 MG PO TABS: 50.0000 mg | ORAL_TABLET | Freq: Four times a day (QID) | ORAL | Status: DC | PRN

## 2011-10-05 MED ORDER — INDOMETHACIN 50 MG PO CAPS: 50.0000 mg | ORAL_CAPSULE | Freq: Two times a day (BID) | ORAL | Status: DC

## 2011-10-05 NOTE — Progress Notes (Signed)
  Subjective:    Patient ID: Katelyn [name], female    DOB: [birth year], 25 y.o.   MRN: [id number]  HPI This is a 25 year old female who presents to the office for her colposcopy results, as well as for pelvic pain and Bartholin's gland cyst. Her colposcopy results showed CIN-1. She has no vaginal discharge.  Her pelvic pain is chronic in nature and she has been evaluated several times in our office prior to this. She has undergone treatment for PID. She is also used oral contraception such as Loestrin intends to control the pelvic pain. Her pain is worse during her menses, but continues to have pain throughout her cycle. Over-the-counter anti-inflammatories have not been used: Controlling her pain. She has been prescribed Vicodin in the past which does help with her pain. She rates her pain is worse in to 9/10. There is radiation in her back. She does have family history of endometriosis, cervical cancer. Several members or her family have had hysterectomies for metromenorrhagia  She also has a Bartholin's gland cyst which she is at evaluated several times. She has undergone a marsupialization. The gland is approximately golf ball in size currently. It is not painful nor erythematous.   Review of Systems  Constitutional: Negative for fever and chills.  Genitourinary: Positive for vaginal pain. Negative for dysuria, urgency, decreased urine volume, vaginal bleeding, vaginal discharge and dyspareunia.       Objective:   Physical Exam  Constitutional: She is oriented to person, place, and time. She appears well-developed and well-nourished.  Abdominal: Soft. Bowel sounds are normal. She exhibits no distension and no mass. There is no tenderness. There is no rebound and no guarding.  Genitourinary:       The external genitalia reveal an enlargement of the left labia approximately 4 cm in diameter. There is no erythema present. The areas nontender but feels fluctuant. The marsupialized area has  reepithelialized.  Neurological: She is alert and oriented to person, place, and time.  Skin: Skin is warm and dry.      Assessment & Plan:  #1  CIN1 with preceding ASCUS, LGSIL PAP Explained results of the colposcopy with patient, including plans for further evaluation.  Will have patient return for PAP in 6 and 12 months.  If both are negative, would resume normal screening.  If either are ASCUS, LGSIL, or HGSIL, would need colposcopy. #2 pelvic pain As hormone treatment has not improved the patient's pelvic pain I will schedule point for the patient to see in OB/GYN to evaluate whether laparoscopic investigation of the patient's abdomen is necessary. I will give the patient's indomethacin for treatment of the patient's pain as well as tramadol to help with moderate to severe pain #3 Bartholin's gland cyst I will schedule the patient was in OB/GYN to evaluate remarsupialization.  As there is no evidence of infection currently, I see no need to urgently place a Wurd catheter or drain the area.

## 2011-10-07 ENCOUNTER — Ambulatory Visit (INDEPENDENT_AMBULATORY_CARE_PROVIDER_SITE_OTHER): Payer: Medicaid Other | Admitting: Obstetrics & Gynecology

## 2011-10-07 ENCOUNTER — Encounter: Payer: Self-pay | Admitting: Obstetrics & Gynecology

## 2011-10-07 VITALS — BP 126/89 | HR 73 | Temp 97.1°F | Ht 61.0 in | Wt 114.2 lb

## 2011-10-07 DIAGNOSIS — N750 Cyst of Bartholin's gland: Secondary | ICD-10-CM

## 2011-10-07 NOTE — Progress Notes (Signed)
Pt states that pain regimen is currently not working.

## 2011-10-07 NOTE — Progress Notes (Addendum)
  Subjective:    Patient ID: Katelyn [name], female    DOB: 03/06/1986, 25 y.o.   MRN: [id number]  HPIPatient's last menstrual period was 09/26/2011. the patient comes today to schedule marsupialization of her left Bartholin's gland. She also has persistent pelvic pain and lower back pain. She has heavy periods that last that 8 days and are painful. She has been on oral contraceptives in the past and she said this did not help her menstrual problems. She takes tramadol and was prescribed was not helping her pain. 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 We discussed marsupialization of her Bartholin's gland and diagnostic laparoscopy to diagnose her pelvic pain. She is concerned that she may have endometriosis. She states that she does have some nocturia and sometimes some urinary urge but she doesn't feel her pain is originating from her bladder. An ultrasound in April of this year showed a 7 mm normal endometrium and no uterine lesions or ovarian cysts. I offered a endometrial ablation with NovaSure. The risks that this procedure would not result in improvement of her menstrual period was discussed. The risks of laparoscopy and the risks that her pain would not be improved afterl laparoscopy was discussed. The risk that the Bartholin's gland abscess would recur after marsupialization was discussed. Anesthesia risk and risk of bleeding infection or abdominal organ damage was also discussed if he voiced understanding.    Review of Systems Lower abnormal pain and low back pain. Nocturia and some urinary frequency and urge.    Objective:   Physical Exam  Filed Vitals:   10/07/11 0856  BP: 126/89  Pulse: 73  Temp: 97.1 F (36.2 C)   No acute distress normal affect. External genitalia shows a 3 cm fluctuant nontender cystic area in the left labium major consistent with a Bartholin's gland. Vaginal and bimanual exam was deferred.      Assessment & Plan:  I offered a cryotherapy for  her mild dysplasia. He will return for cervical cryotherapy and endometrial biopsy as a preoperative evaluation for endometrial ablation. She will be scheduled for marsupialization of the Bartholin's gland diagnostic laparoscopy and NovaSure endometrial ablation. Morry Veiga

## 2011-10-12 ENCOUNTER — Other Ambulatory Visit (HOSPITAL_COMMUNITY)
Admission: RE | Admit: 2011-10-12 | Discharge: 2011-10-12 | Disposition: A | Discharge status: Home / Self Care | Payer: Medicaid Other | Source: Ambulatory Visit | Attending: Obstetrics & Gynecology | Admitting: Obstetrics & Gynecology

## 2011-10-12 ENCOUNTER — Encounter: Payer: Self-pay | Admitting: Obstetrics & Gynecology

## 2011-10-12 ENCOUNTER — Ambulatory Visit (INDEPENDENT_AMBULATORY_CARE_PROVIDER_SITE_OTHER): Payer: Medicaid Other | Admitting: Obstetrics & Gynecology

## 2011-10-12 DIAGNOSIS — N949 Unspecified condition associated with female genital organs and menstrual cycle: Secondary | ICD-10-CM

## 2011-10-12 DIAGNOSIS — N870 Mild cervical dysplasia: Secondary | ICD-10-CM

## 2011-10-12 DIAGNOSIS — R102 Pelvic and perineal pain: Secondary | ICD-10-CM

## 2011-10-12 LAB — POCT PREGNANCY, URINE: Preg Test, Ur: NEGATIVE

## 2011-10-12 MED ORDER — OXYCODONE-ACETAMINOPHEN 5-325 MG PO TABS: ORAL_TABLET | ORAL | Status: DC

## 2011-10-12 NOTE — Progress Notes (Signed)
Patient returns today for cryosurgery of the cervix and for endometrial biopsy. Past menstrual period 09/26/2011. She signed consent for cryosurgery and endometrial biopsy. The risks of pain and bleeding infection or uterine damage were discussed. Her questions were answered. She is scheduled for diagnostic laparoscopy, NovaSure and marsupialization of her Bartholin's gland cyst  November 30. She has fairly, consistent pelvic pain.  Speculum was inserted in the cervix was visualized. It appeared normal. Cervix was prepped with Betadine. Cervix was grasped with a single-tooth tenaculum the endometrial sampler was passed to 8 cm. Pink tissue was obtained and sent to pathology. The small tip was placed on the cryosurgery probe. This was applied to the cervix and a 3 minute cryosurgery cycle was completed. Instruments were removed. She has some cramping during the procedure but she tolerated this well. We will followup on the biopsy result and she will have her planned procedure on November 30. I gave her prescription for Percocet 03/30/24/2015 tablets one to 2 by mouth every 4-6 hours when necessary pain no refills ARNOLD,JAMES 10/12/2011

## 2011-10-12 NOTE — Progress Notes (Signed)
Addended by: Adam Phenix on: 10/12/2011 04:31 PM   Modules accepted: Orders

## 2011-10-13 ENCOUNTER — Telehealth: Payer: Self-pay | Admitting: *Deleted

## 2011-10-13 ENCOUNTER — Encounter (HOSPITAL_COMMUNITY): Payer: Self-pay | Admitting: Pharmacy Technician

## 2011-10-13 MED ORDER — HYDROCORTISONE 2.5 % EX OINT: TOPICAL_OINTMENT | Freq: Two times a day (BID) | CUTANEOUS | Status: DC

## 2011-10-13 NOTE — Telephone Encounter (Signed)
Called pt and informed her that I will send her Rx electronically. Pt requested a different pharmacy- [facility] [address] in [place].

## 2011-10-13 NOTE — Telephone Encounter (Signed)
Pt's mother left message stating that Jimmy needs the Rx for steroid cream- was discussed @ her visit yesterday. Please call RX to [facility] [place].

## 2011-10-19 ENCOUNTER — Encounter (HOSPITAL_COMMUNITY)
Admission: RE | Admit: 2011-10-19 | Discharge: 2011-10-19 | Disposition: A | Discharge status: Home / Self Care | Payer: Medicaid Other | Source: Ambulatory Visit | Attending: Obstetrics & Gynecology | Admitting: Obstetrics & Gynecology

## 2011-10-19 ENCOUNTER — Encounter (HOSPITAL_COMMUNITY): Payer: Self-pay

## 2011-10-19 DIAGNOSIS — Z01812 Encounter for preprocedural laboratory examination: Secondary | ICD-10-CM | POA: Insufficient documentation

## 2011-10-19 DIAGNOSIS — Z01818 Encounter for other preprocedural examination: Secondary | ICD-10-CM | POA: Insufficient documentation

## 2011-10-19 HISTORY — DX: Anxiety disorder, unspecified: F41.9

## 2011-10-19 HISTORY — DX: Attention-deficit hyperactivity disorder, unspecified type: F90.9

## 2011-10-19 LAB — SURGICAL PCR SCREEN: MRSA, PCR: NEGATIVE

## 2011-10-19 LAB — CBC
Hemoglobin: 12.9 g/dL (ref 12.0–15.0)
MCH: 26.9 pg (ref 26.0–34.0)
RBC: 4.8 MIL/uL (ref 3.87–5.11)

## 2011-10-19 NOTE — Patient Instructions (Signed)
YOUR PROCEDURE IS SCHEDULED ON:10/28/11  ENTER THROUGH THE MAIN ENTRANCE OF [facility] AT: 10 am Fri  USE DESK PHONE AND DIAL [phone] TO INFORM us OF YOUR ARRIVAL  CALL [phone] IF YOU HAVE ANY QUESTIONS OR PROBLEMS PRIOR TO YOUR ARRIVAL.  REMEMBER: DO NOT EAT AFTER MIDNIGHT :Thursday  SPECIAL INSTRUCTIONS: clear liquids until 7:30 am Fri   YOU MAY BRUSH YOUR TEETH THE MORNING OF SURGERY   TAKE THESE MEDICINES THE DAY OF SURGERY WITH SIP OF WATER: none   DO NOT WEAR JEWELRY, EYE MAKEUP, LIPSTICK OR DARK FINGERNAIL POLISH DO NOT WEAR LOTIONS OR DEODORANT DO NOT SHAVE FOR 48 HOURS PRIOR TO SURGERY  YOU WILL NOT BE ALLOWED TO DRIVE YOURSELF HOME.  NAME OF DRIVER:Katelyn [name] or Katelyn [name]- mother - [phone]

## 2011-10-28 ENCOUNTER — Encounter (HOSPITAL_COMMUNITY): Payer: Self-pay | Admitting: *Deleted

## 2011-10-28 ENCOUNTER — Encounter (HOSPITAL_COMMUNITY): Payer: Self-pay | Admitting: Anesthesiology

## 2011-10-28 ENCOUNTER — Ambulatory Visit (HOSPITAL_COMMUNITY): Payer: Medicaid Other | Admitting: Anesthesiology

## 2011-10-28 ENCOUNTER — Ambulatory Visit (HOSPITAL_COMMUNITY)
Admission: RE | Admit: 2011-10-28 | Discharge: 2011-10-28 | Disposition: A | Discharge status: Home / Self Care | Payer: Medicaid Other | Source: Ambulatory Visit | Attending: Obstetrics & Gynecology | Admitting: Obstetrics & Gynecology

## 2011-10-28 ENCOUNTER — Encounter (HOSPITAL_COMMUNITY)
Admission: RE | Disposition: A | Discharge status: Home / Self Care | Payer: Self-pay | Source: Ambulatory Visit | Attending: Obstetrics & Gynecology

## 2011-10-28 DIAGNOSIS — N750 Cyst of Bartholin's gland: Secondary | ICD-10-CM

## 2011-10-28 DIAGNOSIS — N920 Excessive and frequent menstruation with regular cycle: Secondary | ICD-10-CM

## 2011-10-28 DIAGNOSIS — Z01812 Encounter for preprocedural laboratory examination: Secondary | ICD-10-CM

## 2011-10-28 DIAGNOSIS — N949 Unspecified condition associated with female genital organs and menstrual cycle: Secondary | ICD-10-CM

## 2011-10-28 DIAGNOSIS — Z01818 Encounter for other preprocedural examination: Secondary | ICD-10-CM | POA: Insufficient documentation

## 2011-10-28 HISTORY — PX: NOVASURE ABLATION: SHX5394

## 2011-10-28 HISTORY — PX: BARTHOLIN CYST MARSUPIALIZATION: SHX5383

## 2011-10-28 HISTORY — PX: LAPAROSCOPY: SHX197

## 2011-10-28 LAB — PREGNANCY, URINE: Preg Test, Ur: NEGATIVE

## 2011-10-28 SURGERY — NOVASURE ABLATION
Anesthesia: General | Site: Vagina | Wound class: Clean Contaminated

## 2011-10-28 MED ORDER — ROCURONIUM BROMIDE 100 MG/10ML IV SOLN
INTRAVENOUS | Status: DC | PRN
  Administered 2011-10-28: 50 mg via INTRAVENOUS

## 2011-10-28 MED ORDER — MEPERIDINE HCL 25 MG/ML IJ SOLN: 6.2500 mg | INTRAMUSCULAR | Status: DC | PRN

## 2011-10-28 MED ORDER — FENTANYL CITRATE 0.05 MG/ML IJ SOLN
INTRAMUSCULAR | Status: AC
  Filled 2011-10-28: qty 2

## 2011-10-28 MED ORDER — MIDAZOLAM HCL 5 MG/5ML IJ SOLN
INTRAMUSCULAR | Status: DC | PRN
  Administered 2011-10-28: 2 mg via INTRAVENOUS

## 2011-10-28 MED ORDER — OXYCODONE-ACETAMINOPHEN 5-325 MG PO TABS
1.0000 | ORAL_TABLET | ORAL | Status: DC | PRN
  Administered 2011-10-28: 1 via ORAL

## 2011-10-28 MED ORDER — GLYCOPYRROLATE 0.2 MG/ML IJ SOLN
INTRAMUSCULAR | Status: DC | PRN
  Administered 2011-10-28 (×2): .4 mg via INTRAVENOUS

## 2011-10-28 MED ORDER — PHENYLEPHRINE HCL 10 MG/ML IJ SOLN
INTRAMUSCULAR | Status: DC | PRN
  Administered 2011-10-28 (×2): 80 mg via INTRAVENOUS

## 2011-10-28 MED ORDER — PROPOFOL 10 MG/ML IV EMUL
INTRAVENOUS | Status: DC | PRN
  Administered 2011-10-28: 200 mg via INTRAVENOUS

## 2011-10-28 MED ORDER — BUPIVACAINE HCL (PF) 0.25 % IJ SOLN
INTRAMUSCULAR | Status: DC | PRN
  Administered 2011-10-28: 5 mL

## 2011-10-28 MED ORDER — FENTANYL CITRATE 0.05 MG/ML IJ SOLN
25.0000 ug | INTRAMUSCULAR | Status: DC | PRN
  Administered 2011-10-28: 50 ug via INTRAVENOUS

## 2011-10-28 MED ORDER — NEOSTIGMINE METHYLSULFATE 1 MG/ML IJ SOLN
INTRAMUSCULAR | Status: AC
  Filled 2011-10-28: qty 10

## 2011-10-28 MED ORDER — FENTANYL CITRATE 0.05 MG/ML IJ SOLN
INTRAMUSCULAR | Status: DC | PRN
  Administered 2011-10-28 (×4): 50 ug via INTRAVENOUS

## 2011-10-28 MED ORDER — BUPIVACAINE-EPINEPHRINE 0.5% -1:200000 IJ SOLN
INTRAMUSCULAR | Status: DC | PRN
  Administered 2011-10-28: 4 mL

## 2011-10-28 MED ORDER — FENTANYL CITRATE 0.05 MG/ML IJ SOLN
INTRAMUSCULAR | Status: AC
  Filled 2011-10-28: qty 4

## 2011-10-28 MED ORDER — LIDOCAINE HCL (CARDIAC) 20 MG/ML IV SOLN
INTRAVENOUS | Status: AC
  Filled 2011-10-28: qty 5

## 2011-10-28 MED ORDER — MIDAZOLAM HCL 2 MG/2ML IJ SOLN
INTRAMUSCULAR | Status: AC
  Filled 2011-10-28: qty 2

## 2011-10-28 MED ORDER — LACTATED RINGERS IV SOLN
INTRAVENOUS | Status: DC
  Administered 2011-10-28 (×2): via INTRAVENOUS

## 2011-10-28 MED ORDER — DEXAMETHASONE SODIUM PHOSPHATE 10 MG/ML IJ SOLN
INTRAMUSCULAR | Status: AC
  Filled 2011-10-28: qty 2

## 2011-10-28 MED ORDER — NEOSTIGMINE METHYLSULFATE 1 MG/ML IJ SOLN
INTRAMUSCULAR | Status: DC | PRN
  Administered 2011-10-28: 3 mg via INTRAVENOUS

## 2011-10-28 MED ORDER — OXYCODONE-ACETAMINOPHEN 5-325 MG PO TABS: 1.0000 | ORAL_TABLET | ORAL | Status: AC | PRN

## 2011-10-28 MED ORDER — GLYCOPYRROLATE 0.2 MG/ML IJ SOLN
INTRAMUSCULAR | Status: AC
  Filled 2011-10-28: qty 1

## 2011-10-28 MED ORDER — LIDOCAINE HCL (CARDIAC) 20 MG/ML IV SOLN
INTRAVENOUS | Status: DC | PRN
  Administered 2011-10-28: 30 mg via INTRAVENOUS

## 2011-10-28 MED ORDER — LACTATED RINGERS IV SOLN: INTRAVENOUS | Status: DC

## 2011-10-28 MED ORDER — ONDANSETRON HCL 4 MG/2ML IJ SOLN
INTRAMUSCULAR | Status: AC
  Filled 2011-10-28: qty 2

## 2011-10-28 MED ORDER — KETOROLAC TROMETHAMINE 30 MG/ML IJ SOLN
INTRAMUSCULAR | Status: DC | PRN
  Administered 2011-10-28: 30 mg via INTRAVENOUS

## 2011-10-28 MED ORDER — OXYCODONE-ACETAMINOPHEN 5-325 MG PO TABS
ORAL_TABLET | ORAL | Status: AC
  Administered 2011-10-28: 1 via ORAL
  Filled 2011-10-28: qty 1

## 2011-10-28 MED ORDER — DEXAMETHASONE SODIUM PHOSPHATE 4 MG/ML IJ SOLN
INTRAMUSCULAR | Status: DC | PRN
  Administered 2011-10-28: 10 mg via INTRAVENOUS

## 2011-10-28 MED ORDER — ONDANSETRON HCL 4 MG/2ML IJ SOLN
INTRAMUSCULAR | Status: DC | PRN
  Administered 2011-10-28: 4 mg via INTRAVENOUS

## 2011-10-28 MED ORDER — ONDANSETRON HCL 4 MG/2ML IJ SOLN: 4.0000 mg | Freq: Once | INTRAMUSCULAR | Status: DC | PRN

## 2011-10-28 SURGICAL SUPPLY — 48 items
ABLATOR ENDOMETRIAL BIPOLAR (ABLATOR) ×5 IMPLANT
ADH SKN CLS APL DERMABOND .7 (GAUZE/BANDAGES/DRESSINGS)
BLADE SURG 15 STRL LF C SS BP (BLADE) ×4 IMPLANT
BLADE SURG 15 STRL SS (BLADE) ×5
CABLE HIGH FREQUENCY MONO STRZ (ELECTRODE) IMPLANT
CATH ROBINSON RED A/P 16FR (CATHETERS) ×5 IMPLANT
CHLORAPREP W/TINT 26ML (MISCELLANEOUS) ×5 IMPLANT
CLOTH BEACON ORANGE TIMEOUT ST (SAFETY) ×5 IMPLANT
CONTAINER PREFILL 10% NBF 15ML (MISCELLANEOUS) ×5 IMPLANT
CONTAINER PREFILL 10% NBF 60ML (FORM) ×10 IMPLANT
COUNTER NEEDLE 1200 MAGNETIC (NEEDLE) IMPLANT
DERMABOND ADVANCED (GAUZE/BANDAGES/DRESSINGS)
DERMABOND ADVANCED .7 DNX12 (GAUZE/BANDAGES/DRESSINGS) IMPLANT
ELECT REM PT RETURN 9FT ADLT (ELECTROSURGICAL)
ELECTRODE REM PT RTRN 9FT ADLT (ELECTROSURGICAL) IMPLANT
GAUZE PACKING 1 X5 YD ST (GAUZE/BANDAGES/DRESSINGS) ×2 IMPLANT
GLOVE BIO SURGEON STRL SZ 6.5 (GLOVE) ×5 IMPLANT
GLOVE BIO SURGEON STRL SZ7 (GLOVE) ×5 IMPLANT
GLOVE BIOGEL PI IND STRL 7.0 (GLOVE) ×8 IMPLANT
GLOVE BIOGEL PI INDICATOR 7.0 (GLOVE) ×2
GOWN PREVENTION PLUS LG XLONG (DISPOSABLE) ×10 IMPLANT
NDL HYPO 25X1 1.5 SAFETY (NEEDLE) ×3 IMPLANT
NDL INSUFFLATION 14GA 120MM (NEEDLE) IMPLANT
NEEDLE HYPO 25X1 1.5 SAFETY (NEEDLE) ×5 IMPLANT
NEEDLE INSUFFLATION 14GA 120MM (NEEDLE) IMPLANT
NS IRRIG 1000ML POUR BTL (IV SOLUTION) ×5 IMPLANT
PACK LAPAROSCOPY BASIN (CUSTOM PROCEDURE TRAY) ×5 IMPLANT
PACK VAGINAL MINOR WOMEN LF (CUSTOM PROCEDURE TRAY) ×5 IMPLANT
PAD PREP 24X48 CUFFED NSTRL (MISCELLANEOUS) ×5 IMPLANT
PENCIL BUTTON HOLSTER BLD 10FT (ELECTRODE) IMPLANT
SCALPEL HARMONIC ACE (MISCELLANEOUS) IMPLANT
SET IRRIG TUBING LAPAROSCOPIC (IRRIGATION / IRRIGATOR) IMPLANT
STRIP CLOSURE SKIN 1/2X4 (GAUZE/BANDAGES/DRESSINGS) ×5 IMPLANT
SUT VIC AB 2-0 CT1 27 (SUTURE) ×5
SUT VIC AB 2-0 CT1 TAPERPNT 27 (SUTURE) ×4 IMPLANT
SUT VICRYL 0 UR6 27IN ABS (SUTURE) ×5 IMPLANT
SUT VICRYL 4-0 PS2 18IN ABS (SUTURE) ×10 IMPLANT
SYR 20CC LL (SYRINGE) ×5 IMPLANT
SYR CONTROL 10ML LL (SYRINGE) IMPLANT
TOWEL OR 17X24 6PK STRL BLUE (TOWEL DISPOSABLE) ×10 IMPLANT
TRAY FOLEY CATH 14FR (SET/KITS/TRAYS/PACK) IMPLANT
TROCAR BALLN 12MMX100 BLUNT (TROCAR) IMPLANT
TROCAR XCEL NON-BLD 5MMX100MML (ENDOMECHANICALS) ×5 IMPLANT
TROCAR Z-THREAD BLADED 11X100M (TROCAR) ×10 IMPLANT
TUBING NON-CON 1/4 X 20 CONN (TUBING) IMPLANT
WARMER LAPAROSCOPE (MISCELLANEOUS) ×5 IMPLANT
WATER STERILE IRR 1000ML POUR (IV SOLUTION) ×5 IMPLANT
YANKAUER SUCT BULB TIP NO VENT (SUCTIONS) IMPLANT

## 2011-10-28 NOTE — Anesthesia Procedure Notes (Signed)
Procedure Name: Intubation Performed by: Dianna Ewald MARIE Pre-anesthesia Checklist: Patient identified, Patient being monitored, Emergency Drugs available, Timeout performed and Suction available Patient Re-evaluated:Patient Re-evaluated prior to inductionOxygen Delivery Method: Circle System Utilized Preoxygenation: Pre-oxygenation with 100% oxygen Intubation Type: IV induction Laryngoscope Size: Mac and 3 Grade View: Grade I Tube type: Oral Placement Confirmation: ETT inserted through vocal cords under direct vision Secured at: 21 cm Dental Injury: Teeth and Oropharynx as per pre-operative assessment

## 2011-10-28 NOTE — Transfer of Care (Signed)
Immediate Anesthesia Transfer of Care Note  Patient: Katelyn [name]  Procedure(s) Performed:  NOVASURE ABLATION; BARTHOLIN CYST MARSUPIALIZATION; LAPAROSCOPY DIAGNOSTIC  Patient Location: PACU  Anesthesia Type: General  Level of Consciousness: awake and alert   Airway & Oxygen Therapy: Patient Spontanous Breathing  Post-op Assessment: Report given to PACU RN and Post -op Vital signs reviewed and stable  Post vital signs: Reviewed and stable  Complications: No apparent anesthesia complications
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
## 2011-10-28 NOTE — Anesthesia Postprocedure Evaluation (Signed)
Anesthesia Post Note  Patient: Katelyn [name]  Procedure(s) Performed:  NOVASURE ABLATION; BARTHOLIN CYST MARSUPIALIZATION; LAPAROSCOPY DIAGNOSTIC  Anesthesia type: GA  Patient location: PACU  Post pain: Pain level controlled  Post assessment: Post-op Vital signs reviewed  Last Vitals:  Filed Vitals:   10/28/11 1000  BP: 113/91  Pulse: 107  Temp: 36.7 C  Resp: 18    Post vital signs: Reviewed  Level of consciousness: sedated  Complications: No apparent anesthesia complications

## 2011-10-28 NOTE — Brief Op Note (Signed)
10/28/2011  12:53 PM  PATIENT:  Katelyn [name]  25 y.o. female  PRE-OPERATIVE DIAGNOSIS:  Pelvic Pain; Dysmenorrhea; Menorrhagia; Bartholin Gland Cyst  POST-OPERATIVE DIAGNOSIS:  Pelvic Pain; Dysmenorrhea; Menorrhagia; Bartholin Gland Cyst  PROCEDURE:  Procedure(s): NOVASURE ABLATION BARTHOLIN CYST MARSUPIALIZATION LAPAROSCOPY DIAGNOSTIC  SURGEON:  Surgeon(s): Scheryl Darter, MD  PHYSICIAN ASSISTANT:   ASSISTANTS: none   ANESTHESIA:   general  EBL:  Total I/O In: 1000 [I.V.:1000] Out: -   BLOOD ADMINISTERED:none  DRAINS: 1 inch gauze   LOCAL MEDICATIONS USED:  MARCAINE 10CC  SPECIMEN:  No Specimen  DISPOSITION OF SPECIMEN:  N/A  COUNTS:  YES  TOURNIQUET:  * No tourniquets in log *  DICTATION: .Other Dictation: Dictation Number [id number]  PLAN OF CARE: Discharge to home after PACU  PATIENT DISPOSITION:  PACU - hemodynamically stable.   Delay start of Pharmacological VTE agent (>24hrs) due to surgical blood loss or risk of bleeding: n/a

## 2011-10-28 NOTE — Anesthesia Preprocedure Evaluation (Signed)
Anesthesia Evaluation  Patient identified by MRN, date of birth, ID band Patient awake    Reviewed: Allergy & Precautions, H&P , NPO status , Patient's Chart, lab work & pertinent test results  Airway Mallampati: I      Dental No notable dental hx.    Pulmonary Current Smoker,    Pulmonary exam normal       Cardiovascular neg cardio ROS     Neuro/Psych PSYCHIATRIC DISORDERS Anxiety    GI/Hepatic negative GI ROS, Neg liver ROS,   Endo/Other  Negative Endocrine ROS  Renal/GU negative Renal ROS  Genitourinary negative   Musculoskeletal negative musculoskeletal ROS (+)   Abdominal Normal abdominal exam  (+)   Peds negative pediatric ROS (+)  Hematology negative hematology ROS (+)   Anesthesia Other Findings   Reproductive/Obstetrics negative OB ROS                           Anesthesia Physical Anesthesia Plan  ASA: II  Anesthesia Plan: General   Post-op Pain Management:    Induction: Intravenous  Airway Management Planned: Oral ETT  Additional Equipment:   Intra-op Plan:   Post-operative Plan: Extubation in OR  Informed Consent: I have reviewed the patients History and Physical, chart, labs and discussed the procedure including the risks, benefits and alternatives for the proposed anesthesia with the patient or authorized representative who has indicated his/her understanding and acceptance.   Dental advisory given  Plan Discussed with: CRNA  Anesthesia Plan Comments:         Anesthesia Quick Evaluation

## 2011-10-29 NOTE — Op Note (Signed)
NAME:  Katelyn [name], Katelyn [name]            ACCOUNT NO.:  [id number]  MEDICAL RECORD NO.:  [id number]  LOCATION:  [facility]                          [facility]:  [facility]  PHYSICIAN:  Scheryl Darter, MD       DATE OF BIRTH:  [birth year]  DATE OF PROCEDURE:  10/28/2011 DATE OF DISCHARGE:  10/28/2011                              OPERATIVE REPORT   PROCEDURES:  Diagnostic laparoscopy, NovaSure endometrial ablation, and marsupialization of Bartholin gland cyst.  PREOPERATIVE DIAGNOSES:  Menorrhagia, chronic pelvic pain, and chronic left Bartholin gland cyst.  POSTOPERATIVE DIAGNOSES:  Menorrhagia, chronic pelvic pain, and chronic left Bartholin gland cyst.  SURGEON:  Scheryl Darter, MD  ANESTHESIA:  General.  ESTIMATED BLOOD LOSS:  Negligible.  SPECIMENS:  None.  DRAINS:  None.  COUNTS:  Correct.  OPERATIVE COURSE:  The patient gave written consent for diagnostic laparoscopy, marsupialization of Bartholin gland cyst, and endometrial ablation.  The patient's identification was confirmed.  She was brought to the OR and general anesthesia was induced.  She was placed in dorsal lithotomy position, and abdomen, perineum and vagina were sterilely prepped and draped.  A 3-4 cm right Bartholin gland cyst was identified. A Foley catheter was placed.  After Foley draping, a #11 blade was used to make a vertical incision at the umbilicus.  Veress needle was placed and CO2 was insufflated.  A 10-mm trocar was placed at the umbilicus. The laparoscope was inserted with video camera in use.  A Hulka tenaculum had been placed on the cervix.  This was used to manipulate the uterus.  Uterus, tubes, and ovaries appeared normal.  The second puncture with a 5-mm port was placed in the left lower quadrant and a probe was used to help visualize the pelvic organs.  There was no sign of endometriosis.  There were no adhesions.  The appendix appeared normal as did the right upper quadrant.  All instruments were removed and  CO2 was released.  A 0 Vicryl suture was placed at the fascia at the umbilicus and skin of the umbilicus was closed with interrupted subcuticular sutures with 4-0 Vicryl and sterile dressing was applied. Dermabond was used to close the skin incision at the left lower quadrant.  The Hulka tenaculum was removed.  Speculum was inserted and the cervix was visualized.  The cervix was grasped with a single-tooth tenaculum.  Using the SureSound device after dilating the cervix, the uterine cavity was measured at 5 mm.  Cervix was dilated sufficiently to pass the NovaSure device.  The device was deployed and 140-second treatment cycle was completed without problems.  Instrument was removed. A 0.5% Marcaine with 1:200,000 epinephrine was infiltrated into the skin over the Bartholin cyst on the left labium.  A #15 blade was used to incise at the site that was finished over the cyst.  This appeared to be the site of previous incisions.  Clear mucus was expressed.  Incision was extended to about 2 cm.  A 2-0 Vicryl was used with a running suture to marsupialize the cyst wall to the skin edge.  There was good hemostasis.  A 1-inch gauze pack was placed and this was to be removed tomorrow by the patient.  The patient tolerated the procedure well without complications.  She was brought in stable condition to the recovery room.     Scheryl Darter, MD     JA/MEDQ  D:  10/28/2011  T:  10/29/2011  Job:  [id number]

## 2011-10-31 ENCOUNTER — Encounter (HOSPITAL_COMMUNITY): Payer: Self-pay | Admitting: Obstetrics & Gynecology

## 2011-11-02 ENCOUNTER — Ambulatory Visit (INDEPENDENT_AMBULATORY_CARE_PROVIDER_SITE_OTHER): Payer: Self-pay | Admitting: Obstetrics and Gynecology

## 2011-11-02 ENCOUNTER — Encounter: Payer: Self-pay | Admitting: Obstetrics and Gynecology

## 2011-11-02 VITALS — BP 125/72 | HR 81 | Temp 98.0°F | Ht 62.0 in | Wt 114.2 lb

## 2011-11-02 DIAGNOSIS — G8918 Other acute postprocedural pain: Secondary | ICD-10-CM

## 2011-11-02 MED ORDER — OXYCODONE-ACETAMINOPHEN 5-325 MG PO TABS: 1.0000 | ORAL_TABLET | Freq: Four times a day (QID) | ORAL | Status: DC | PRN

## 2011-11-02 NOTE — Progress Notes (Signed)
Pt declines flu vaccine

## 2011-11-04 NOTE — Progress Notes (Signed)
S: Pt presents today c/o a watery vag dc and pain. She had an endometrial ablation, dx lap, and marsupialization of bartholin's this past Friday. She states she became concerned when she began to notice a thin, watery vag dc. She also thinks she has had a fever at home and she is out of pain medication. O: VSS afebrile. A&Ox3 in NAD Abd soft, nontender. No rebound. No guarding. NL external genitalia. Marsupialization site appears to be healing well. Speculum exam performed. There is a thin, watery dc present in the vag vault consistent with NL post op discharge. Bimanual exam reveals uterus to be NL size and shape with slight tenderness. No adnexal masses. A/P: post op: pt appears to be healing well. Will refill percocet. Gave post op instructions and precautions. She has f/u with Dr. Debroah Loop scheduled.  Clinton Gallant. Rice III, DrHSc, MPAS, PA-C

## 2011-11-14 ENCOUNTER — Telehealth: Payer: Self-pay | Admitting: *Deleted

## 2011-11-14 ENCOUNTER — Inpatient Hospital Stay (HOSPITAL_COMMUNITY): Payer: Medicaid Other

## 2011-11-14 ENCOUNTER — Inpatient Hospital Stay (HOSPITAL_COMMUNITY)
Admission: AD | Admit: 2011-11-14 | Discharge: 2011-11-15 | Disposition: A | Discharge status: Home / Self Care | Payer: Medicaid Other | Source: Ambulatory Visit | Attending: Obstetrics & Gynecology | Admitting: Obstetrics & Gynecology

## 2011-11-14 ENCOUNTER — Encounter (HOSPITAL_COMMUNITY): Payer: Self-pay | Admitting: *Deleted

## 2011-11-14 DIAGNOSIS — N870 Mild cervical dysplasia: Secondary | ICD-10-CM | POA: Insufficient documentation

## 2011-11-14 DIAGNOSIS — N949 Unspecified condition associated with female genital organs and menstrual cycle: Secondary | ICD-10-CM

## 2011-11-14 DIAGNOSIS — N750 Cyst of Bartholin's gland: Secondary | ICD-10-CM | POA: Insufficient documentation

## 2011-11-14 DIAGNOSIS — R109 Unspecified abdominal pain: Secondary | ICD-10-CM | POA: Insufficient documentation

## 2011-11-14 DIAGNOSIS — R102 Pelvic and perineal pain: Secondary | ICD-10-CM

## 2011-11-14 LAB — CBC
Hemoglobin: 12.2 g/dL (ref 12.0–15.0)
MCH: 27.7 pg (ref 26.0–34.0)
Platelets: 252 10*3/uL (ref 150–400)
RBC: 4.4 MIL/uL (ref 3.87–5.11)
WBC: 15.3 10*3/uL — ABNORMAL HIGH (ref 4.0–10.5)

## 2011-11-14 MED ORDER — HYDROMORPHONE HCL PF 1 MG/ML IJ SOLN
1.0000 mg | Freq: Once | INTRAMUSCULAR | Status: AC
  Administered 2011-11-14: 1 mg via INTRAMUSCULAR
  Filled 2011-11-14: qty 1
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
## 2011-11-14 NOTE — Telephone Encounter (Signed)
Pt left message stating that she had surgery on 11/30 and is concerned about the amount of bleeding she is having. She would like to speak to a nurse.

## 2011-11-14 NOTE — Progress Notes (Signed)
Pt post ablation, marsupilization, exp lap on 11/30.  Pt reports heavy bleeding over the weekend, today spotting.  Increased lower abd pain and burning today.

## 2011-11-14 NOTE — ED Notes (Signed)
Lab tech in room for blood work.

## 2011-11-15 MED ORDER — OXYCODONE-ACETAMINOPHEN 10-325 MG PO TABS: 1.0000 | ORAL_TABLET | ORAL | Status: AC | PRN

## 2011-11-15 MED ORDER — IBUPROFEN 800 MG PO TABS: 800.0000 mg | ORAL_TABLET | Freq: Three times a day (TID) | ORAL | Status: AC | PRN

## 2011-11-15 MED ORDER — AZITHROMYCIN 250 MG PO TABS: ORAL_TABLET | ORAL | Status: AC

## 2011-11-15 MED ORDER — OXYCODONE-ACETAMINOPHEN 5-325 MG PO TABS
2.0000 | ORAL_TABLET | Freq: Once | ORAL | Status: AC
  Administered 2011-11-15: 2 via ORAL
  Filled 2011-11-15: qty 2

## 2011-11-15 NOTE — Telephone Encounter (Signed)
Pt was seen in MAU on 11/14/11 for this bleeding issue.

## 2011-11-17 ENCOUNTER — Encounter (HOSPITAL_COMMUNITY): Payer: Self-pay | Admitting: *Deleted

## 2011-11-17 ENCOUNTER — Telehealth: Payer: Self-pay | Admitting: Obstetrics and Gynecology

## 2011-11-17 ENCOUNTER — Inpatient Hospital Stay (HOSPITAL_COMMUNITY)
Admission: AD | Admit: 2011-11-17 | Discharge: 2011-11-17 | Disposition: A | Discharge status: Home / Self Care | Payer: Medicaid Other | Source: Ambulatory Visit | Attending: Obstetrics & Gynecology | Admitting: Obstetrics & Gynecology

## 2011-11-17 DIAGNOSIS — R112 Nausea with vomiting, unspecified: Secondary | ICD-10-CM

## 2011-11-17 DIAGNOSIS — IMO0002 Reserved for concepts with insufficient information to code with codable children: Secondary | ICD-10-CM | POA: Insufficient documentation

## 2011-11-17 DIAGNOSIS — R102 Pelvic and perineal pain: Secondary | ICD-10-CM

## 2011-11-17 MED ORDER — ONDANSETRON HCL 4 MG PO TABS
8.0000 mg | ORAL_TABLET | Freq: Four times a day (QID) | ORAL | Status: AC
Start: 2011-11-17 — End: 2011-11-24

## 2011-11-17 MED ORDER — ALPRAZOLAM 0.25 MG PO TABS: 0.2500 mg | ORAL_TABLET | Freq: Three times a day (TID) | ORAL | Status: DC | PRN

## 2011-11-17 NOTE — Telephone Encounter (Signed)
Patient called wanting to be seen today from c/o puss oozing out of her belly button, incision "opening", and unresolved pain on lower abd and lower back despite oxycodone that was given to her from MAU this past Saturday. Advised patient that we don't have any slots available for today and that she needs to go back to MAU. Patient agrees.

## 2011-11-17 NOTE — ED Notes (Signed)
Umbilical incision intact, no drainage noted @ present, small red spot noted on skin at upper edge of incision.
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
## 2011-11-17 NOTE — Progress Notes (Signed)
Fever of 102 this morning. WBC count up the other day.

## 2011-11-17 NOTE — Progress Notes (Signed)
11/30 marsupulization, laproscopic, ablation.  Was here on Sun for bleeding, stopped Mon, Tues bled again, spotting now.  States had a "pus" ball at incision (umbilicus), popped it this morning, is still "layed open" and bleeding.

## 2011-11-30 ENCOUNTER — Ambulatory Visit (INDEPENDENT_AMBULATORY_CARE_PROVIDER_SITE_OTHER): Payer: Medicaid Other | Admitting: Obstetrics & Gynecology

## 2011-11-30 ENCOUNTER — Encounter: Payer: Self-pay | Admitting: Obstetrics & Gynecology

## 2011-11-30 VITALS — BP 127/85 | HR 91 | Temp 97.9°F | Ht 61.0 in | Wt 114.8 lb

## 2011-11-30 DIAGNOSIS — R102 Pelvic and perineal pain: Secondary | ICD-10-CM

## 2011-11-30 DIAGNOSIS — N949 Unspecified condition associated with female genital organs and menstrual cycle: Secondary | ICD-10-CM

## 2011-11-30 DIAGNOSIS — Z09 Encounter for follow-up examination after completed treatment for conditions other than malignant neoplasm: Secondary | ICD-10-CM

## 2011-11-30 MED ORDER — DOXYCYCLINE HYCLATE 100 MG PO TABS: 100.0000 mg | ORAL_TABLET | Freq: Two times a day (BID) | ORAL | Status: AC

## 2011-11-30 MED ORDER — OXYCODONE-ACETAMINOPHEN 5-325 MG PO TABS: 1.0000 | ORAL_TABLET | Freq: Four times a day (QID) | ORAL | Status: DC | PRN

## 2011-11-30 MED ORDER — ALPRAZOLAM 0.25 MG PO TABS: 0.2500 mg | ORAL_TABLET | Freq: Three times a day (TID) | ORAL | Status: AC | PRN

## 2011-11-30 NOTE — Patient Instructions (Signed)
Pelvic Pain in Women, Generic  Pelvic pain may be constant or come and go. It may be mild or severe. It is important to tell your caregiver exactly where the pain is located, when and how it occurs, and if it is related to your menstrual periods or stress. We have not found a definite cause for your pelvic pain today and you may need follow-up testing and examination.  CAUSES    Sexually transmitted diseases (STDS) cause pelvic inflammatory disease (PID). This is one of the most common causes of pelvic pain. It is an infection of the female sexual organs.   Endometriosis - This is a condition where some of the inside lining of the uterus is growing in the pelvis and abdomen outside the uterus. Along with (chronic) pain, this can cause infertility.   Tubal pregnancy - This is a serious condition where the pregnancy has occurred in a fallopian tube. Rupture of the tube can bleed heavily and cause death if it is not found in time.   Interstitial cystitis is an inflammation of the bladder that causes pelvic pain. People with severe cases of IC may urinate as many as 60 times a day.   Fibroids: A small percentage of women have uterine fibroids (non-cancerous smooth muscle growths in the uterus). Fibroids do not always cause pain.   Fibromyalgia is a disorder with symptoms of widespread muscle pain, fatigue and multiple tender points on the body.   Dysmenorrhea is painful menstrual periods.   Mittlesmertz is pain with ovulation.   Pelvic congestive syndrome, is engorgement of the pelvic veins just before and during a menstrual period.   Cervical stenosis is when the opening of the cervix is too small and causes pain during menstruation.   Adenomyosis (a type of endometriosis) glands that line the inside of the uterus lying in the muscle layer of the uterus.   Intestinal problems such as irritable bowel syndrome colitis or ileitis.   Appendicitis.   Pelvic cancer. Usually the cancer has been there for awhile  before causing pain.   Bladder infection.   Cysts or ovarian tumors.   Kidney stone.   Psychological factors (stress, sexual abuse or depression).   IUD (intrauterine device).   Prolapse (falling down of the uterus).   Retroflexed uterus - the uterus is tipped too far backwards.   Muscle spasms of the pelvic muscles.   Muscular-skeletal problems of the back (herniated disc).  DIAGNOSIS    Your caregiver may order testing, such as:   Blood tests.   Cultures to test for infection.   Ultrasound.   Looking into the bladder with a metal tube with a light (cystoscopy).   Looking into the pelvis and abdomen with very small incisions through a metal tube with a light (laparoscopy).   Looking into the large intestine with a fibro-optic tube with a light (colonoscopy).   CT scan - a type of X-ray to view the internal organs of the pelvis and abdomen.   MRI - views the pelvic and abdominal organs with a magnetic machine.   Intravenous pyelogram - views the kidneys, ureter and bladder after injecting dye through the vein by X-rays.   Injecting barium into the large intestine to view the intestine with X-rays (barium enema).   Not all test results are available during your visit. If your test results are not back during the visit, make an appointment with your caregiver or the medical facility. It is important for you to follow up   test results.  TREATMENT  Treatment will depend on the cause of the pain, such as:  Medication, antibiotics, pain medication, muscle relaxants, anti-depression drugs, hormones or birth control pills.   Physical therapy.   Acupuncture.   Psychiatric counseling.   Nerve blocks.   Surgery.  HOME CARE INSTRUCTIONS   Finish all medication as prescribed. Incomplete treatment will put you at risk for sterility and tubal pregnancy if your caregiver feels your pain is caused by an infection.   Rest and eat a balanced  diet with plenty of fluids.   If you do have an infection, your recent sexual partners may need treatment even if they are symptom-free or have a negative culture or evaluation. You also need follow-up to make sure you are no longer infected.   Only take over-the-counter or prescription medicines for pain, discomfort or fever as directed by your caregiver.   Apply warm or cold compresses to the lower abdomen depending on which one helps the pain.   Avoid stressful situations that may cause the pain.   Group therapy is sometimes helpful.   Make sure to follow all instructions. Some of the conditions listed above can have very serious outcomes if you do not take the time to follow-up with your caregiver.  SEEK IMMEDIATE MEDICAL CARE IF:   There is heavier bleeding from the birth canal (vagina).   You develop increasing abdominal pain.   You feel lightheaded or pass out.   An unexplained oral temperature above 102 F (38.9 C) develops.   Any of the problems which brought you to [place] are getting worse.   You are being physically or sexually abused.   You have painful urination.   You are still having pain four hours after taking prescription pain medication.   You have uncontrolled diarrhea.   You have abnormal vaginal discharge.  Document Released: 10/11/2004 Document Revised: 07/27/2011 Document Reviewed: 11/11/2008 [facility] Patient Information 2012 [place], [place].
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
## 2011-12-21 ENCOUNTER — Ambulatory Visit (INDEPENDENT_AMBULATORY_CARE_PROVIDER_SITE_OTHER): Payer: Medicaid Other | Admitting: Obstetrics & Gynecology

## 2011-12-21 ENCOUNTER — Encounter: Payer: Self-pay | Admitting: Obstetrics & Gynecology

## 2011-12-21 VITALS — BP 119/76 | HR 96 | Temp 98.8°F | Ht 61.0 in | Wt 115.5 lb

## 2011-12-21 DIAGNOSIS — R102 Pelvic and perineal pain: Secondary | ICD-10-CM

## 2011-12-21 DIAGNOSIS — N949 Unspecified condition associated with female genital organs and menstrual cycle: Secondary | ICD-10-CM

## 2011-12-21 MED ORDER — IBUPROFEN 800 MG PO TABS: 800.0000 mg | ORAL_TABLET | Freq: Three times a day (TID) | ORAL | Status: DC | PRN

## 2011-12-21 MED ORDER — OXYCODONE-ACETAMINOPHEN 5-325 MG PO TABS: 1.0000 | ORAL_TABLET | Freq: Four times a day (QID) | ORAL | Status: DC | PRN

## 2011-12-21 NOTE — Progress Notes (Signed)
Addended by: Adam Phenix on: 12/21/2011 03:50 PM   Modules accepted: Orders
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
## 2011-12-21 NOTE — Patient Instructions (Signed)
Pelvic Pain in Women, Generic Pelvic pain may be constant or come and go. It may be mild or severe. It is important to tell your caregiver exactly where the pain is located, when and how it occurs, and if it is related to your menstrual periods or stress. We have not found a definite cause for your pelvic pain today and you may need follow-up testing and examination. CAUSES   Sexually transmitted diseases (STDS) cause pelvic inflammatory disease (PID). This is one of the most common causes of pelvic pain. It is an infection of the female sexual organs.   Endometriosis - This is a condition where some of the inside lining of the uterus is growing in the pelvis and abdomen outside the uterus. Along with (chronic) pain, this can cause infertility.   Tubal pregnancy - This is a serious condition where the pregnancy has occurred in a fallopian tube. Rupture of the tube can bleed heavily and cause death if it is not found in time.   Interstitial cystitis is an inflammation of the bladder that causes pelvic pain. People with severe cases of IC may urinate as many as 60 times a day.   Fibroids: A small percentage of women have uterine fibroids (non-cancerous smooth muscle growths in the uterus). Fibroids do not always cause pain.   Fibromyalgia is a disorder with symptoms of widespread muscle pain, fatigue and multiple tender points on the body.   Dysmenorrhea is painful menstrual periods.   Mittlesmertz is pain with ovulation.   Pelvic congestive syndrome, is engorgement of the pelvic veins just before and during a menstrual period.   Cervical stenosis is when the opening of the cervix is too small and causes pain during menstruation.   Adenomyosis (a type of endometriosis) glands that line the inside of the uterus lying in the muscle layer of the uterus.   Intestinal problems such as irritable bowel syndrome colitis or ileitis.   Appendicitis.   Pelvic cancer. Usually the cancer has been  there for awhile before causing pain.   Bladder infection.   Cysts or ovarian tumors.   Kidney stone.   Psychological factors (stress, sexual abuse or depression).   IUD (intrauterine device).   Prolapse (falling down of the uterus).   Retroflexed uterus - the uterus is tipped too far backwards.   Muscle spasms of the pelvic muscles.   Muscular-skeletal problems of the back (herniated disc).  DIAGNOSIS   Your caregiver may order testing, such as:   Blood tests.   Cultures to test for infection.   Ultrasound.   Looking into the bladder with a metal tube with a light (cystoscopy).   Looking into the pelvis and abdomen with very small incisions through a metal tube with a light (laparoscopy).   Looking into the large intestine with a fibro-optic tube with a light (colonoscopy).   CT scan - a type of X-ray to view the internal organs of the pelvis and abdomen.   MRI - views the pelvic and abdominal organs with a magnetic machine.   Intravenous pyelogram - views the kidneys, ureter and bladder after injecting dye through the vein by X-rays.   Injecting barium into the large intestine to view the intestine with X-rays (barium enema).   Not all test results are available during your visit. If your test results are not back during the visit, make an appointment with your caregiver or the medical facility. It is important for you to follow up on all of your   test results.  TREATMENT  Treatment will depend on the cause of the pain, such as:  Medication, antibiotics, pain medication, muscle relaxants, anti-depression drugs, hormones or birth control pills.   Physical therapy.   Acupuncture.   Psychiatric counseling.   Nerve blocks.   Surgery.  HOME CARE INSTRUCTIONS   Finish all medication as prescribed. Incomplete treatment will put you at risk for sterility and tubal pregnancy if your caregiver feels your pain is caused by an infection.   Rest and eat a balanced  diet with plenty of fluids.   If you do have an infection, your recent sexual partners may need treatment even if they are symptom-free or have a negative culture or evaluation. You also need follow-up to make sure you are no longer infected.   Only take over-the-counter or prescription medicines for pain, discomfort or fever as directed by your caregiver.   Apply warm or cold compresses to the lower abdomen depending on which one helps the pain.   Avoid stressful situations that may cause the pain.   Group therapy is sometimes helpful.   Make sure to follow all instructions. Some of the conditions listed above can have very serious outcomes if you do not take the time to follow-up with your caregiver.  SEEK IMMEDIATE MEDICAL CARE IF:   There is heavier bleeding from the birth canal (vagina).   You develop increasing abdominal pain.   You feel lightheaded or pass out.   An unexplained oral temperature above 102 F (38.9 C) develops.   Any of the problems which brought you to us are getting worse.   You are being physically or sexually abused.   You have painful urination.   You are still having pain four hours after taking prescription pain medication.   You have uncontrolled diarrhea.   You have abnormal vaginal discharge.  Document Released: 10/11/2004 Document Revised: 07/27/2011 Document Reviewed: 11/11/2008 [employer] Patient Information 2012 [employer]. 

## 2012-01-12 ENCOUNTER — Telehealth: Payer: Self-pay | Admitting: *Deleted

## 2012-01-12 ENCOUNTER — Ambulatory Visit (INDEPENDENT_AMBULATORY_CARE_PROVIDER_SITE_OTHER): Payer: Medicaid Other | Admitting: Physician Assistant

## 2012-01-12 VITALS — BP 133/88 | HR 82 | Temp 98.4°F | Ht 60.75 in | Wt 119.8 lb

## 2012-01-12 DIAGNOSIS — R102 Pelvic and perineal pain: Secondary | ICD-10-CM

## 2012-01-12 DIAGNOSIS — N949 Unspecified condition associated with female genital organs and menstrual cycle: Secondary | ICD-10-CM

## 2012-01-12 DIAGNOSIS — Z3201 Encounter for pregnancy test, result positive: Secondary | ICD-10-CM

## 2012-01-12 LAB — POCT PREGNANCY, URINE: Preg Test, Ur: NEGATIVE

## 2012-01-12 NOTE — Progress Notes (Signed)
Has taken 5 pregnancy tests at home, 2 were positive, 3 were negatives, c/o past 2 months breasts tender,then has had morning sickness and since of smell has intensified and having cravings Like when she was pregnant before, c/o fatigue, history ablation and BTS  VSS, NAD, Anxious   Assessment/Plan: 1. Positive home pregnancy test with negative office UPT  Beta-quant for confirmation  If negative quant, schedule appt with MD for further evaluation. If positive, will follow quants and obtain US as appropriate.  Huxley Vanwagoner E. 4:01 PM

## 2012-01-12 NOTE — Patient Instructions (Signed)
Pelvic Pain Pelvic pain is pain below the belly button and located between your hips. Acute pain may last a few hours or days. Chronic pelvic pain may last weeks and months. The cause may be different for different types of pain. The pain may be dull or sharp, mild or severe and can interfere with your daily activities. Write down and tell your caregiver:   Exactly where the pain is located.   If it comes and goes or is there all the time.   When it happens (with sex, urination, bowel movement, etc.)   If the pain is related to your menstrual period or stress.  Your caregiver will take a full history and do a complete physical exam and Pap test. CAUSES   Painful menstrual periods (dysmenorrhea).   Normal ovulation (Mittelschmertz) that occurs in the middle of the menstrual cycle every month.   The pelvic organs get engorged with blood just before the menstrual period (pelvic congestive syndrome).   Scar tissue from an infection or past surgery (pelvic adhesions).   Cancer of the female pelvic organs. When there is pain with cancer, it has been there for a long time.   The lining of the uterus (endometrium) abnormally grows in places like the pelvis and on the pelvic organs (endometriosis).   A form of endometriosis with the lining of the uterus present inside of the muscle tissue of the uterus (adenomyosis).   Fibroid tumor (noncancerous) in the uterus.   Bladder problems such as infection, bladder spasms of the muscle tissue of the bladder.   Intestinal problems (irritable bowel syndrome, colitis, an ulcer or gastrointestinal infection).   Polyps of the cervix or uterus.   Pregnancy in the tube (ectopic pregnancy).   The opening of the cervix is too small for the menstrual blood to flow through it (cervical stenosis).   Physical or sexual abuse (past or present).   Musculo-skeletal problems from poor posture, problems with the vertebrae of the lower back or the uterine  pelvic muscles falling (prolapse).   Psychological problems such as depression or stress.   IUD (intrauterine device) in the uterus.  DIAGNOSIS  Tests to make a diagnosis depends on the type, location, severity and what causes the pain to occur. Tests that may be needed include:  Blood tests.   Urine tests   Ultrasound.   X-rays.   CT Scan.   MRI.   Laparoscopy.   Major surgery.  TREATMENT  Treatment will depend on the cause of the pain, which includes:  Prescription or over-the-counter pain medication.   Antibiotics.   Birth control pills.   Hormone treatment.   Nerve blocking injections.   Physical therapy.   Antidepressants.   Counseling with a psychiatrist or psychologist.   Minor or major surgery.  HOME CARE INSTRUCTIONS   Only take over-the-counter or prescription medicines for pain, discomfort or fever as directed by your caregiver.   Follow your caregiver's advice to treat your pain.   Rest.   Avoid sexual intercourse if it causes the pain.   Apply warm or cold compresses (which ever works best) to the pain area.   Do relaxation exercises such as yoga or meditation.   Try acupuncture.   Avoid stressful situations.   Try group therapy.   If the pain is because of a stomach/intestinal upset, drink clear liquids, eat a bland light food diet until the symptoms go away.  SEEK MEDICAL CARE IF:   You need stronger prescription pain medication.     You develop pain with sexual intercourse.   You have pain with urination.   You develop a temperature of 102 F (38.9 C) with the pain.   You are still in pain after 4 hours of taking prescription medication for the pain.   You need depression medication.   Your IUD is causing pain and you want it removed.  SEEK IMMEDIATE MEDICAL CARE IF:  You develop very severe pain or tenderness.   You faint, have chills, severe weakness or dehydration.   You develop heavy vaginal bleeding or passing  solid tissue.   You develop a temperature of 102 F (38.9 C) with the pain.   You have blood in the urine.   You are being physically or sexually abused.   You have uncontrolled vomiting and diarrhea.   You are depressed and afraid of harming yourself or someone else.  Document Released: 12/22/2004 Document Revised: 07/27/2011 Document Reviewed: 09/18/2008 [employer] Patient Information 2012 [employer]. 

## 2012-01-12 NOTE — Telephone Encounter (Signed)
Pt called with concerns of being pregnant. She had a BTL 5 years ago and recently had an ablation in November.  Beginning in January she noticed that her breast were becoming sensitive and larger. She has also developed nausea and vomiting in the morning. She took a upt on Monday 2/11 that was positive, she also has had a negative test. I advised that she come in to our office this afternoon to have a upt here and if necessary a beta hcg. Pt agrees and will come in around 2pm.

## 2012-01-12 NOTE — Progress Notes (Signed)
Has taken 5 pregnancy tests at home, 2 were positive, 3 were negatives, c/o past 2 months breasts tender,then has had morning sickness and since of smell has intensified and having cravings   Like when she was pregnant before, c/o fatigue, history ablation and btl

## 2012-01-13 ENCOUNTER — Telehealth: Payer: Self-pay | Admitting: *Deleted

## 2012-01-13 LAB — HCG, QUANTITATIVE, PREGNANCY: hCG, Beta Chain, Quant, S: <2 m[IU]/mL

## 2012-01-13 NOTE — Telephone Encounter (Signed)
Pt left message requesting test results from yesterday.  I returned pt call and informed her of negative blood pregnancy test. Pt voiced understanding.

## 2012-02-01 ENCOUNTER — Emergency Department (HOSPITAL_COMMUNITY)
Admission: EM | Admit: 2012-02-01 | Discharge: 2012-02-01 | Disposition: A | Discharge status: Home Health | Payer: Medicaid Other | Source: Home / Self Care | Attending: Family Medicine | Admitting: Family Medicine

## 2012-02-01 ENCOUNTER — Encounter (HOSPITAL_COMMUNITY): Payer: Self-pay | Admitting: Emergency Medicine

## 2012-02-01 DIAGNOSIS — J329 Chronic sinusitis, unspecified: Secondary | ICD-10-CM

## 2012-02-01 DIAGNOSIS — R05 Cough: Secondary | ICD-10-CM

## 2012-02-01 MED ORDER — GUAIFENESIN-CODEINE 100-10 MG/5ML PO SYRP: 5.0000 mL | ORAL_SOLUTION | Freq: Four times a day (QID) | ORAL | Status: DC | PRN

## 2012-02-01 MED ORDER — AZITHROMYCIN 250 MG PO TABS: 250.0000 mg | ORAL_TABLET | Freq: Every day | ORAL | Status: DC

## 2012-02-01 NOTE — Discharge Instructions (Signed)
Take antibiotics as directed. Use cough syrup as needed for cough; do not drive or work while taking. Return to care should your symptoms not improve, or worsen in any way.
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
## 2012-02-01 NOTE — ED Notes (Signed)
PT HERE WITH SINUS SX STUFFY NOSE,SINUS PRESSURE,SORE THROAT THAT STARTED LAST Friday BUT HAS PROGRESSED WITH FEVERS ON/OFF 102.0-103.0 RELIEVED BY IBUPROFEN ,WEAKNESS,DIZZINESS AND POOR APPETITE WITH VOMITING FROM FREQ COUGH.YELLOW PHLEGM/BLOOD TINGED AND PAIN WITH SWALLOWING OR YAWNING.AFEBRILE

## 2012-02-06 ENCOUNTER — Encounter (HOSPITAL_COMMUNITY): Payer: Self-pay

## 2012-02-06 ENCOUNTER — Emergency Department (HOSPITAL_COMMUNITY)
Admission: EM | Admit: 2012-02-06 | Discharge: 2012-02-06 | Disposition: A | Discharge status: Home Health | Payer: Medicaid Other | Attending: Emergency Medicine | Admitting: Emergency Medicine

## 2012-02-06 ENCOUNTER — Other Ambulatory Visit: Payer: Self-pay | Admitting: Obstetrics & Gynecology

## 2012-02-06 DIAGNOSIS — F909 Attention-deficit hyperactivity disorder, unspecified type: Secondary | ICD-10-CM | POA: Insufficient documentation

## 2012-02-06 DIAGNOSIS — F411 Generalized anxiety disorder: Secondary | ICD-10-CM | POA: Insufficient documentation

## 2012-02-06 DIAGNOSIS — D649 Anemia, unspecified: Secondary | ICD-10-CM | POA: Insufficient documentation

## 2012-02-06 DIAGNOSIS — J329 Chronic sinusitis, unspecified: Secondary | ICD-10-CM | POA: Insufficient documentation

## 2012-02-06 DIAGNOSIS — F17200 Nicotine dependence, unspecified, uncomplicated: Secondary | ICD-10-CM | POA: Insufficient documentation

## 2012-02-06 MED ORDER — HYDROCODONE-HOMATROPINE 5-1.5 MG/5ML PO SYRP: 2.5000 mL | ORAL_SOLUTION | Freq: Four times a day (QID) | ORAL | Status: AC | PRN

## 2012-02-06 MED ORDER — DOXYCYCLINE HYCLATE 100 MG PO CAPS: 100.0000 mg | ORAL_CAPSULE | Freq: Two times a day (BID) | ORAL | Status: AC

## 2012-02-06 NOTE — ED Notes (Signed)
Pt has completed a Z-pack for stated sinus infection w/o cessation of sx.

## 2012-02-06 NOTE — Discharge Instructions (Signed)
As we discussed, the length of your symptoms warrant antibiotic treatment for sinusitis. Please take the antibiotic as directed. The number for the ear nose and throat doctors listed above. You should see him if your symptoms do not improve after antibiotic treatment.          Sinusitis Sinuses are air pockets within the bones of your face. The growth of bacteria within a sinus leads to infection. The infection prevents the sinuses from draining. This infection is called sinusitis. SYMPTOMS  There will be different areas of pain depending on which sinuses have become infected.  The maxillary sinuses often produce pain beneath the eyes.   Frontal sinusitis may cause pain in the middle of the forehead and above the eyes.  Other problems (symptoms) include:  Toothaches.   Colored, pus-like (purulent) drainage from the nose.   Swelling, warmth, and tenderness over the sinus areas may be signs of infection.  TREATMENT  Sinusitis is most often determined by an exam.X-rays may be taken. If x-rays have been taken, make sure you obtain your results or find out how you are to obtain them. Your caregiver may give you medications (antibiotics). These are medications that will help kill the bacteria causing the infection. You may also be given a medication (decongestant) that helps to reduce sinus swelling.  HOME CARE INSTRUCTIONS   Only take over-the-counter or prescription medicines for pain, discomfort, or fever as directed by your caregiver.   Drink extra fluids. Fluids help thin the mucus so your sinuses can drain more easily.   Applying either moist heat or ice packs to the sinus areas may help relieve discomfort.   Use saline nasal sprays to help moisten your sinuses. The sprays can be found at your local drugstore.  SEEK IMMEDIATE MEDICAL CARE IF:  You have a fever.   You have increasing pain, severe headaches, or toothache.   You have nausea, vomiting, or drowsiness.   You  develop unusual swelling around the face or trouble seeing.  MAKE SURE YOU:   Understand these instructions.   Will watch your condition.   Will get help right away if you are not doing well or get worse.  Document Released: 11/14/2005 Document Revised: 11/03/2011 Document Reviewed: 06/13/2007 [facility] Patient Information 2012 [place], [place].

## 2012-02-06 NOTE — ED Provider Notes (Signed)
Medical screening examination/treatment/procedure(s) were performed by non-physician practitioner and as supervising physician I was immediately available for consultation/collaboration.  Maudie Shingledecker R. Trella Thurmond, MD 02/06/12 2027 
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
## 2012-02-06 NOTE — ED Notes (Signed)
Sinus infection since last Friday

## 2012-02-22 ENCOUNTER — Encounter (HOSPITAL_COMMUNITY): Payer: Self-pay | Admitting: *Deleted

## 2012-02-22 ENCOUNTER — Emergency Department (HOSPITAL_COMMUNITY): Admission: EM | Admit: 2012-02-22 | Discharge: 2012-02-22 | Payer: Self-pay | Source: Home / Self Care

## 2012-02-22 ENCOUNTER — Emergency Department (HOSPITAL_COMMUNITY)
Admission: EM | Admit: 2012-02-22 | Discharge: 2012-02-22 | Disposition: A | Discharge status: Home / Self Care | Payer: Medicaid Other | Attending: Emergency Medicine | Admitting: Emergency Medicine

## 2012-02-22 DIAGNOSIS — Z8619 Personal history of other infectious and parasitic diseases: Secondary | ICD-10-CM | POA: Insufficient documentation

## 2012-02-22 DIAGNOSIS — M549 Dorsalgia, unspecified: Secondary | ICD-10-CM | POA: Insufficient documentation

## 2012-02-22 DIAGNOSIS — F909 Attention-deficit hyperactivity disorder, unspecified type: Secondary | ICD-10-CM | POA: Insufficient documentation

## 2012-02-22 MED ORDER — OXYCODONE-ACETAMINOPHEN 5-325 MG PO TABS
1.0000 | ORAL_TABLET | Freq: Once | ORAL | Status: AC
  Administered 2012-02-22: 1 via ORAL

## 2012-02-22 MED ORDER — OXYCODONE-ACETAMINOPHEN 5-325 MG PO TABS: 1.0000 | ORAL_TABLET | Freq: Four times a day (QID) | ORAL | Status: AC | PRN

## 2012-02-22 MED ORDER — ACYCLOVIR 400 MG PO TABS: 400.0000 mg | ORAL_TABLET | Freq: Four times a day (QID) | ORAL | Status: AC

## 2012-02-22 MED ORDER — OXYCODONE-ACETAMINOPHEN 5-325 MG PO TABS
ORAL_TABLET | ORAL | Status: AC
  Filled 2012-02-22: qty 1
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
## 2012-02-22 NOTE — Discharge Instructions (Signed)
Shingles Shingles is caused by the same virus that causes chickenpox (varicella zoster virus or VZV). Shingles often occurs many years or decades after having chickenpox. That is why it is more common in adults older than 50 years. The virus reactivates and breaks out as an infection in a nerve root. SYMPTOMS   The initial feeling (sensations) may be pain. This pain is usually described as:   Burning.   Stabbing.   Throbbing.   Tingling in the nerve root.   A red rash will follow in a couple days. The rash may occur in any area of the body and is usually on one side (unilateral) of the body in a band or belt-like pattern. The rash usually starts out as very small blisters (vesicles). They will dry up after 7 to 10 days. This is not usually a significant problem except for the pain it causes.   Long-lasting (chronic) pain is more likely in an elderly person. It can last months to years. This condition is called postherpetic neuralgia.  Shingles can be an extremely severe infection in someone with AIDS, a weakened immune system, or with forms of leukemia. It can also be severe if you are taking transplant medicines or other medicines that weaken the immune system. TREATMENT  Your caregiver will often treat you with:  Antiviral drugs.   Anti-inflammatory drugs.   Pain medicines.  Bed rest is very important in preventing the pain associated with herpes zoster (postherpetic neuralgia). Application of heat in the form of a hot water bottle or electric heating pad or gentle pressure with the hand is recommended to help with the pain or discomfort. PREVENTION  A varicella zoster vaccine is available to help protect against the virus. The Food and Drug Administration approved the varicella zoster vaccine for individuals 50 years of age and older. HOME CARE INSTRUCTIONS   Cool compresses to the area of rash may be helpful.   Only take over-the-counter or prescription medicines for pain,  discomfort, or fever as directed by your caregiver.   Avoid contact with:   Babies.   Pregnant women.   Children with eczema.   Elderly people with transplants.   People with chronic illnesses, such as leukemia and AIDS.   If the area involved is on your face, you may receive a referral for follow-up to a specialist. It is very important to keep all follow-up appointments. This will help avoid eye complications, chronic pain, or disability.  SEEK IMMEDIATE MEDICAL CARE IF:   You develop any pain (headache) in the area of the face or eye. This must be followed carefully by your caregiver or ophthalmologist. An infection in part of your eye (cornea) can be very serious. It could lead to blindness.   You do not have pain relief from prescribed medicines.   Your redness or swelling spreads.   The area involved becomes very swollen and painful.   You have a fever.   You notice any red or painful lines extending away from the affected area toward your heart (lymphangitis).   Your condition is worsening or has changed.  Document Released: 11/14/2005 Document Revised: 11/03/2011 Document Reviewed: 10/19/2009 [facility] Patient Information 2012 [place], [place].   RESOURCE GUIDE  Dental Problems  Patients with Medicaid: [facility]                     [phone] [address]  Phone:  [phone]                                                  If unable to pay or uninsured, contact:  [facility] or [facility]. to become qualified for the adult dental clinic.  Chronic Pain Problems Contact [facility]  [phone] Patients need to be referred by their primary care doctor.  Insufficient Money for Medicine Contact [employer]:  call "211" or [facility] [phone]) 621-2460.  No Primary Care Doctor Call [facility]  [phone] Other agencies that provide inexpensive medical care    [facility]  [phone]    [facility]  [phone]    [facility]  [phone]    [facility]  [phone]    [facility]  [phone]    [facility]  [phone]  Substance Abuse Resources [facility]  ([phone] [facility] ([phone] [facility] [place] [phone] Floyd[facility] Flock [phone] Residential & Outpatient Substance Abuse Program  [phone]  Psychological Services [facility]  [phone] [facility]  [phone] [facility]   [phone] (emergency services [phone])  Abuse/Neglect [facility] Child Abuse Hotline [phone] [facility] Child Abuse Hotline 503-432-4600 (After Hours)  Emergency Shelter [facility] [phone]  M[facility] [place] [facility] [phone] [facility] ([phone]  MRSA Hotline #:   618-405-3876    [facility] Resources  [facility] [place]  [employer]                           [facility] Dept. [address] [place]                     [address]         [address]  Blondell Reveal Phone:  [phone]                                  Phone:  [phone]                   Phone:  [phone]  [facility] Phone:  [phone]  [facility] Child Abuse Hotline [phone])[phone] (After Hours)

## 2012-02-22 NOTE — ED Notes (Signed)
Pt c/o back pain that she describes as pain just like "shingle pain".  Her pain is located left of the center of her back.  Pt advises pain started 2 days ago in the am.  Pt denies any rash or other complications at present.

## 2012-02-27 HISTORY — PX: OTHER SURGICAL HISTORY: SHX169

## 2012-02-29 NOTE — ED Provider Notes (Signed)
Medical screening examination/treatment/procedure(s) were performed by non-physician practitioner and as supervising physician I was immediately available for consultation/collaboration.  Raeford Razor, MD 02/29/12 2348

## 2012-03-12 ENCOUNTER — Encounter (HOSPITAL_COMMUNITY): Payer: Self-pay | Admitting: *Deleted

## 2012-03-12 ENCOUNTER — Inpatient Hospital Stay (HOSPITAL_COMMUNITY)
Admission: AD | Admit: 2012-03-12 | Discharge: 2012-03-12 | Disposition: A | Discharge status: Home / Self Care | Payer: Medicaid Other | Source: Ambulatory Visit | Attending: Obstetrics and Gynecology | Admitting: Obstetrics and Gynecology

## 2012-03-12 DIAGNOSIS — N949 Unspecified condition associated with female genital organs and menstrual cycle: Secondary | ICD-10-CM | POA: Insufficient documentation

## 2012-03-12 DIAGNOSIS — N751 Abscess of Bartholin's gland: Secondary | ICD-10-CM | POA: Insufficient documentation

## 2012-03-12 MED ORDER — LIDOCAINE HCL 2 % EX GEL: CUTANEOUS | Status: DC | PRN

## 2012-03-12 MED ORDER — SODIUM CHLORIDE 0.9 % IJ SOLN
INTRAMUSCULAR | Status: AC
  Filled 2012-03-12: qty 3

## 2012-03-12 MED ORDER — OXYCODONE-ACETAMINOPHEN 5-325 MG PO TABS
1.0000 | ORAL_TABLET | Freq: Once | ORAL | Status: AC
  Administered 2012-03-12: 1 via ORAL
  Filled 2012-03-12: qty 1

## 2012-03-12 MED ORDER — KETOROLAC TROMETHAMINE 60 MG/2ML IM SOLN
60.0000 mg | Freq: Once | INTRAMUSCULAR | Status: AC
  Administered 2012-03-12: 60 mg via INTRAMUSCULAR
  Filled 2012-03-12: qty 2

## 2012-03-12 MED ORDER — SULFAMETHOXAZOLE-TRIMETHOPRIM 800-160 MG PO TABS: 1.0000 | ORAL_TABLET | Freq: Two times a day (BID) | ORAL | Status: AC

## 2012-03-12 MED ORDER — LIDOCAINE HCL 2 % EX GEL
Freq: Once | CUTANEOUS | Status: AC
  Administered 2012-03-12: 10 via TOPICAL
  Filled 2012-03-12: qty 20

## 2012-03-12 MED ORDER — FLUCONAZOLE 150 MG PO TABS
150.0000 mg | ORAL_TABLET | Freq: Once | ORAL | Status: AC
  Administered 2012-03-12: 150 mg via ORAL
  Filled 2012-03-12: qty 1

## 2012-03-12 MED ORDER — OXYCODONE-ACETAMINOPHEN 5-325 MG PO TABS: 1.0000 | ORAL_TABLET | ORAL | Status: AC | PRN
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
## 2012-03-12 NOTE — MAU Note (Signed)
Pt in c/o large, hard vaginal lump in left labia.  Had a marsupialization last November.  Started becoming sore 2 days ago, and had become swollen that night.

## 2012-03-13 ENCOUNTER — Inpatient Hospital Stay (HOSPITAL_COMMUNITY)
Admission: AD | Admit: 2012-03-13 | Discharge: 2012-03-14 | Disposition: A | Discharge status: Home / Self Care | Payer: Medicaid Other | Source: Ambulatory Visit | Attending: Obstetrics and Gynecology | Admitting: Obstetrics and Gynecology

## 2012-03-13 ENCOUNTER — Encounter (HOSPITAL_COMMUNITY): Payer: Self-pay | Admitting: *Deleted

## 2012-03-13 DIAGNOSIS — R112 Nausea with vomiting, unspecified: Secondary | ICD-10-CM | POA: Insufficient documentation

## 2012-03-13 MED ORDER — PROMETHAZINE HCL 25 MG/ML IJ SOLN
25.0000 mg | Freq: Once | INTRAVENOUS | Status: AC
  Administered 2012-03-13: 25 mg via INTRAVENOUS
  Filled 2012-03-13: qty 1

## 2012-03-13 NOTE — MAU Note (Signed)
PT SAYS SHE HAD SURGERY  IN  NOV.   HAD BARTH CYST DRAINED YESTERDAY IN MAU. SAYS HAS BEEN VOMITING SINCE HAD CYST DRAINED. - CAN'T TAKE ANY MED.

## 2012-03-14 ENCOUNTER — Telehealth: Payer: Self-pay | Admitting: *Deleted

## 2012-03-14 MED ORDER — ONDANSETRON 8 MG PO TBDP: 8.0000 mg | ORAL_TABLET | Freq: Three times a day (TID) | ORAL | Status: AC | PRN

## 2012-03-14 MED ORDER — PROMETHAZINE HCL 25 MG PO TABS: 25.0000 mg | ORAL_TABLET | Freq: Four times a day (QID) | ORAL | Status: DC | PRN

## 2012-03-14 NOTE — Discharge Instructions (Signed)
Get your prescriptions filled and use for nausea and vomiting at home. Keep your follow up appointment in the clinic.

## 2012-03-14 NOTE — Telephone Encounter (Signed)
Pt left message stating that she had been seen @ MAU on 4/15 and had I&D of Bartholin's cyst. She is having vomiting and cannot keep anything down including water and her medications. She was told to call us of this happened. Please call back. I reviewed chart notes and observed that pt was seen @ MAU last night for evaluation and treatment of these complaints.  No return call placed to pt today.
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
## 2012-03-19 NOTE — MAU Provider Note (Signed)
Agree with above note.  Katelyn [name] 03/19/2012 9:46 AM

## 2012-03-19 NOTE — MAU Provider Note (Signed)
Agree with above note.  Katelyn [name] 03/19/2012 9:45 AM

## 2012-03-29 ENCOUNTER — Encounter: Payer: Self-pay | Admitting: Family Medicine

## 2012-03-29 ENCOUNTER — Ambulatory Visit (INDEPENDENT_AMBULATORY_CARE_PROVIDER_SITE_OTHER): Payer: Medicaid Other | Admitting: Obstetrics & Gynecology

## 2012-03-29 DIAGNOSIS — N750 Cyst of Bartholin's gland: Secondary | ICD-10-CM

## 2012-03-29 NOTE — Patient Instructions (Signed)

## 2012-03-29 NOTE — Progress Notes (Signed)
[id number] Patient's last menstrual period was 10/28/2011.   States ready for gland to be removed on left and hysterectomy- states tired of dealing with this- has had 2 marsupilizations, and repeated visits. States has pains  In ovary/pelvic with pains that shoot downward. She does not bleed after her endometrial ablation she still has cyclic pain which is disabling. Laparoscopy in November did not demonstrate any endometriosis. She declined Lupron in January. She now feels as though a hysterectomy and bilateral salpingo-oophorectomy would necessary to alleviate her cyclic pain. She asked to have the word catheter removed from the Bartholin's abscess that was drained  April 15. pmh Past Surgical History  Procedure Date  . Tubal ligation 2007  . Tonsillectomy   . Bartholin cyst marsupialization 11/2010  . Wisdom tooth extraction age 26  . Novasure ablation 10/28/2011    Procedure: NOVASURE ABLATION;  Surgeon: Scheryl Darter, MD;  Location: [facility];  Service: Gynecology;  Laterality: N/A;  . Bartholin cyst marsupialization 10/28/2011    Procedure: BARTHOLIN CYST MARSUPIALIZATION;  Surgeon: Scheryl Darter, MD;  Location: [facility];  Service: Gynecology;  Laterality: N/A;  . Laparoscopy 10/28/2011    Procedure: LAPAROSCOPY DIAGNOSTIC;  Surgeon: Scheryl Darter, MD;  Location: [facility];  Service: Gynecology;  Laterality: N/A;  . Incision and drain bartholin cyst 02/2012   Current Outpatient Prescriptions on File Prior to Visit  Medication Sig Dispense Refill  . Multiple Vitamins-Minerals (WOMENS MULTIVITAMIN PLUS PO) Take 1 tablet by mouth at bedtime.       . lidocaine (XYLOCAINE JELLY) 2 % jelly Apply topically as needed.  30 mL  0  . promethazine (PHENERGAN) 25 MG tablet Take 1 tablet (25 mg total) by mouth every 6 (six) hours as needed for nausea.  20 tablet  0  . pseudoephedrine (SUDAFED) 30 MG tablet Take 30 mg by mouth every 4 (four) hours as needed. For allergies/congestion       Allergies  Allergen  Reactions  . Amoxicillin-Pot Clavulanate Other (See Comments)    Yeast infection.  . Oxycontin (Oxycodone Hcl Er) Itching  . Vicodin (Hydrocodone-Acetaminophen) Other (See Comments)    Hyper ("jumping up and down")   History   Social History  . Marital Status: Single    Spouse Name: N/A    Number of Children: N/A  . Years of Education: N/A   Occupational History  . Not on file.   Social History Main Topics  . Smoking status: Current Everyday Smoker -- 0.2 packs/day for 8 years    Types: Cigarettes  . Smokeless tobacco: Never Used  . Alcohol Use: No     socially rarely  . Drug Use: No  . Sexually Active: Not Currently    Birth Control/ Protection: Surgical     [pt states hasn't had sex in more than 2 months   Other Topics Concern  . Not on file   Social History Narrative  . No narrative on file   Family History  Problem Relation Age of Onset  . Hypertension Mother   . Hyperlipidemia Mother   . Depression Mother   . Heart disease Father   . Anesthesia problems Neg Hx    Review of systems: Cyclic pelvic pain. No vaginal bleeding or discharge. Irrigation from the Word catheter. Nausea vomiting. No dysuria. She has pain with intercourse and has avoided intercourse for last 2 months. Filed Vitals:   03/29/12 1600  BP: 129/82  Pulse: 83  Temp: 98.2 F (36.8 C)  TempSrc: Oral  Height: 5'  1.25" (1.556 m)  Weight: 121 lb (54.885 kg)   Patient becomes anxious and tearful during conversation.  Abdomen flat soft nontender no masses Pelvic: External genitalia showed the presence of the Word catheter on the left side. This was cut and drained and removed. There is a small amount of mucous discharge from the opening of the gland. Exam was otherwise deferred today. Operative findings from November 2012 were reviewed.  Impression cyclic pelvic pain which has not improved after endometrial ablation. Endometriosis was not done at the time of diagnostic laparoscopy. Patient has  missed work and school because of the symptoms. She would like to have definitive therapy and have a hysterectomy and removal of her tubes and ovaries. Discussed that this still may not take away all of her pain. She would like to schedule the procedure. We'll schedule transvaginal hysterectomy and bilateral salpingo-oophorectomy. The procedure and the risks of continued pain were discussed. The risk of bleeding infection anesthesia complications, bowel or urinary tract damage were discussed. Her questions were answered.  Dr. Scheryl Darter 03/29/2012 5:10 PM

## 2012-04-04 ENCOUNTER — Other Ambulatory Visit: Payer: Self-pay | Admitting: *Deleted

## 2012-04-04 ENCOUNTER — Encounter (HOSPITAL_COMMUNITY): Payer: Self-pay | Admitting: Pharmacist

## 2012-04-04 NOTE — Telephone Encounter (Signed)
Laysa called and left a message on voicemail  Yesterday stating she was here last week on Thursday  And had a catheter removed from her bartholin cyst. States they have scheduled her for a vaginal hysterectomy an removal of left bartholin gland, but states it is coming back . States she just spoke to a nurse in preop and they said maybe doctor would prescribe an antibiotic  So it doesn't become abcessed before surgery. Called Dr. Macon Large to discuss and instructed to call patient and offer office visit so we can check on it- may need I&D, encourage sitz baths , may have Bactrim DS 1 po BID x 7 days if insists.  Called patient - states doesn't want I&D, explained antibiotic would help with infection but will not  Prevent swelling-. Patient agreed to sitz bath, and agreed to appointment tomorrow with Dr.Arnold who saw her last week and is doing her surgery, also agreed to wait until sees doctor tomorrow to decide if needs antibiotic.

## 2012-04-05 ENCOUNTER — Ambulatory Visit (INDEPENDENT_AMBULATORY_CARE_PROVIDER_SITE_OTHER): Payer: Medicaid Other | Admitting: Obstetrics & Gynecology

## 2012-04-05 ENCOUNTER — Encounter (HOSPITAL_COMMUNITY): Payer: Self-pay

## 2012-04-05 ENCOUNTER — Encounter: Payer: Self-pay | Admitting: Obstetrics & Gynecology

## 2012-04-05 ENCOUNTER — Encounter (HOSPITAL_COMMUNITY)
Admission: RE | Admit: 2012-04-05 | Discharge: 2012-04-05 | Disposition: A | Discharge status: Home / Self Care | Payer: Medicaid Other | Source: Ambulatory Visit | Attending: Obstetrics & Gynecology | Admitting: Obstetrics & Gynecology

## 2012-04-05 VITALS — BP 132/81 | HR 109 | Temp 97.5°F | Ht 61.0 in | Wt 117.6 lb

## 2012-04-05 DIAGNOSIS — N949 Unspecified condition associated with female genital organs and menstrual cycle: Secondary | ICD-10-CM

## 2012-04-05 DIAGNOSIS — F418 Other specified anxiety disorders: Secondary | ICD-10-CM

## 2012-04-05 DIAGNOSIS — F411 Generalized anxiety disorder: Secondary | ICD-10-CM

## 2012-04-05 DIAGNOSIS — R102 Pelvic and perineal pain: Secondary | ICD-10-CM

## 2012-04-05 DIAGNOSIS — N898 Other specified noninflammatory disorders of vagina: Secondary | ICD-10-CM

## 2012-04-05 LAB — CBC
Hemoglobin: 14 g/dL (ref 12.0–15.0)
MCH: 27.3 pg (ref 26.0–34.0)
MCHC: 32.6 g/dL (ref 30.0–36.0)
MCV: 84 fL (ref 78.0–100.0)

## 2012-04-05 LAB — SURGICAL PCR SCREEN: MRSA, PCR: NEGATIVE

## 2012-04-05 MED ORDER — FLUCONAZOLE 150 MG PO TABS: 150.0000 mg | ORAL_TABLET | Freq: Once | ORAL | Status: AC

## 2012-04-05 MED ORDER — DICLOFENAC SODIUM 75 MG PO TBEC: 75.0000 mg | DELAYED_RELEASE_TABLET | Freq: Two times a day (BID) | ORAL | Status: DC

## 2012-04-05 MED ORDER — ALPRAZOLAM 0.5 MG PO TABS: 0.5000 mg | ORAL_TABLET | Freq: Two times a day (BID) | ORAL | Status: DC | PRN

## 2012-04-05 NOTE — Progress Notes (Signed)
Subjective:     Patient ID: Katelyn [name], female   DOB: [birth year], 26 y.o.   MRN: [id number]  HPI 26 yo F patient with a past medical history significant for chronic pelvic pain and recurrent Bartholin's cyst presents to discussed the following:  1. Anxiety: anxious about hysterectomy scheduled for next weeks. Has a lot to arrange regarding the care of her daughters. She has not been sleeping well. Denies history of anxiety. Reports intermittent situational anxiety regarding her pelvic pain.  2. Pain in ovaries: chronic pelvic pain for many years. I s/p ex-lap for presumed endometriosis and uterine ablation. Still has pain and thus the scheduled hysterectomy. Taking motrin 800 mg without relief. Reports bilateral flank pain.   3. Vaginal discharge x few days, thick, white, pruritic. Completed a course of septra 3 weeks ago. Is engaged. Denies sexual intercourse.   Review of Systems As per HPI    Objective:   Physical Exam BP 132/81  Pulse 109  Temp(Src) 97.5 F (36.4 C) (Oral)  Ht 5\' 1"  (1.549 m)  Wt 117 lb 9.6 oz (53.343 kg)  BMI 22.22 kg/m2  LMP 10/28/2011 General appearance: alert, cooperative and moderate distress Pelvic: external genitalia normal except L labia > R and punctate lesion presumably at site of Bartholin cyst catheter. Thick white vaginal discahrge noted at labia.    Assessment and Plan:   See problem list

## 2012-04-05 NOTE — Patient Instructions (Addendum)
20 ANNY SAYLER  04/05/2012   Your procedure is scheduled on:  04/12/12  Enter through the Main Entrance of [facility] at 1130 AM.  Pick up the phone at the desk and dial [phone].   Call this number if you have problems the morning of surgery: [phone]   Remember:   Do not eat food:After Midnight.  Do not drink clear liquids: 4 Hours before arrival.  Take these medicines the morning of surgery with A SIP OF WATER: NA   Do not wear jewelry, make-up or nail polish.  Do not wear lotions, powders, or perfumes. You may wear deodorant.  Do not shave 48 hours prior to surgery.  Do not bring valuables to the hospital.  Contacts, dentures or bridgework may not be worn into surgery.  Leave suitcase in the car. After surgery it may be brought to your room.  For patients admitted to the hospital, checkout time is 11:00 AM the day of discharge.   Patients discharged the day of surgery will not be allowed to drive home.  Name and phone number of your driver: NA  Special Instructions: CHG Shower Use Special Wash: 1/2 bottle night before surgery and 1/2 bottle morning of surgery.   Please read over the following fact sheets that you were given: MRSA Information

## 2012-04-05 NOTE — Assessment & Plan Note (Signed)
A: situational anxiety. Patient has resources and family help (fiance and mom). Insomnia is a major feature: P: -coping skills discussed as per AVS -xanax 0.5 mg BID x 20 tabs

## 2012-04-05 NOTE — Assessment & Plan Note (Signed)
A: Persistent pain refractory to conservative measures.  P: -stop motrin -start diclofenac -continue with planned hysterectomy

## 2012-04-05 NOTE — Assessment & Plan Note (Signed)
A: wet prep collected. Vaginal discharge collected for wet prep. P: empirical treatment for vaginal candidiasis with diflucan.

## 2012-04-05 NOTE — Patient Instructions (Addendum)
Katelyn [name],  It was very nice to meet you.  For your anxiety and pain:  1. Do everything in your power to relax (I know this is easier said than done) but walk, have sex, play with your girls outside, whatever to relax. 2. Take xanax at night and another time during the day as needed for anxiety/insomnia 3. Stop motrin for pain and start diclofenac twice daily.   For discharge: it looks like yeast. Will treat with dlifucan and call you if it turns out to be something else  Drs. Zayaan Kozak and Debroah Loop

## 2012-04-06 LAB — WET PREP, GENITAL

## 2012-04-11 MED ORDER — DEXTROSE 5 % IV SOLN
INTRAVENOUS | Status: DC
  Filled 2012-04-11: qty 2.5

## 2012-04-11 MED ORDER — GENTAMICIN SULFATE 40 MG/ML IJ SOLN
INTRAVENOUS | Status: AC
  Administered 2012-04-12: 100 mL via INTRAVENOUS
  Filled 2012-04-11: qty 2.5

## 2012-04-12 ENCOUNTER — Encounter (HOSPITAL_COMMUNITY): Payer: Self-pay | Admitting: *Deleted

## 2012-04-12 ENCOUNTER — Ambulatory Visit (HOSPITAL_COMMUNITY): Payer: Medicaid Other | Admitting: Anesthesiology

## 2012-04-12 ENCOUNTER — Encounter (HOSPITAL_COMMUNITY)
Admission: RE | Disposition: A | Discharge status: Home / Self Care | Payer: Self-pay | Source: Ambulatory Visit | Attending: Obstetrics & Gynecology

## 2012-04-12 ENCOUNTER — Encounter (HOSPITAL_COMMUNITY): Payer: Self-pay | Admitting: Anesthesiology

## 2012-04-12 ENCOUNTER — Ambulatory Visit (HOSPITAL_COMMUNITY)
Admission: RE | Admit: 2012-04-12 | Discharge: 2012-04-13 | Disposition: A | Discharge status: Home / Self Care | Payer: Medicaid Other | Source: Ambulatory Visit | Attending: Obstetrics & Gynecology | Admitting: Obstetrics & Gynecology

## 2012-04-12 DIAGNOSIS — N8000 Endometriosis of the uterus, unspecified: Secondary | ICD-10-CM | POA: Insufficient documentation

## 2012-04-12 DIAGNOSIS — N831 Corpus luteum cyst of ovary, unspecified side: Secondary | ICD-10-CM | POA: Insufficient documentation

## 2012-04-12 DIAGNOSIS — N8300 Follicular cyst of ovary, unspecified side: Secondary | ICD-10-CM | POA: Insufficient documentation

## 2012-04-12 DIAGNOSIS — N870 Mild cervical dysplasia: Secondary | ICD-10-CM | POA: Insufficient documentation

## 2012-04-12 DIAGNOSIS — N949 Unspecified condition associated with female genital organs and menstrual cycle: Secondary | ICD-10-CM

## 2012-04-12 HISTORY — PX: VAGINAL HYSTERECTOMY: SHX2639

## 2012-04-12 HISTORY — PX: SALPINGOOPHORECTOMY: SHX82

## 2012-04-12 LAB — PREGNANCY, URINE: Preg Test, Ur: NEGATIVE

## 2012-04-12 SURGERY — HYSTERECTOMY, VAGINAL
Anesthesia: General | Site: Vagina | Wound class: Clean Contaminated

## 2012-04-12 MED ORDER — SCOPOLAMINE 1 MG/3DAYS TD PT72
1.0000 | MEDICATED_PATCH | Freq: Once | TRANSDERMAL | Status: DC | PRN
  Administered 2012-04-12: 1.5 mg via TRANSDERMAL

## 2012-04-12 MED ORDER — PROPOFOL 10 MG/ML IV EMUL
INTRAVENOUS | Status: AC
  Filled 2012-04-12: qty 20

## 2012-04-12 MED ORDER — HYDROMORPHONE HCL PF 1 MG/ML IJ SOLN: 1.0000 mg | INTRAMUSCULAR | Status: DC | PRN

## 2012-04-12 MED ORDER — KETOROLAC TROMETHAMINE 30 MG/ML IJ SOLN
30.0000 mg | Freq: Four times a day (QID) | INTRAMUSCULAR | Status: DC
  Administered 2012-04-12 – 2012-04-13 (×2): 30 mg via INTRAVENOUS
  Filled 2012-04-12 (×2): qty 1

## 2012-04-12 MED ORDER — MIDAZOLAM HCL 2 MG/2ML IJ SOLN
INTRAMUSCULAR | Status: AC
  Filled 2012-04-12: qty 2

## 2012-04-12 MED ORDER — DIPHENHYDRAMINE HCL 12.5 MG/5ML PO ELIX
12.5000 mg | ORAL_SOLUTION | Freq: Four times a day (QID) | ORAL | Status: DC | PRN
  Filled 2012-04-12: qty 5

## 2012-04-12 MED ORDER — HYDROMORPHONE HCL PF 1 MG/ML IJ SOLN
INTRAMUSCULAR | Status: AC
  Filled 2012-04-12: qty 1

## 2012-04-12 MED ORDER — ONDANSETRON HCL 4 MG/2ML IJ SOLN: 4.0000 mg | Freq: Four times a day (QID) | INTRAMUSCULAR | Status: DC | PRN

## 2012-04-12 MED ORDER — ROCURONIUM BROMIDE 100 MG/10ML IV SOLN
INTRAVENOUS | Status: DC | PRN
  Administered 2012-04-12: 10 mg via INTRAVENOUS
  Administered 2012-04-12: 20 mg via INTRAVENOUS

## 2012-04-12 MED ORDER — DIPHENHYDRAMINE HCL 50 MG/ML IJ SOLN: 12.5000 mg | Freq: Four times a day (QID) | INTRAMUSCULAR | Status: DC | PRN

## 2012-04-12 MED ORDER — FENTANYL CITRATE 0.05 MG/ML IJ SOLN
INTRAMUSCULAR | Status: DC | PRN
  Administered 2012-04-12: 100 ug via INTRAVENOUS
  Administered 2012-04-12: 50 ug via INTRAVENOUS
  Administered 2012-04-12: 100 ug via INTRAVENOUS

## 2012-04-12 MED ORDER — SODIUM CHLORIDE 0.9 % IJ SOLN: 9.0000 mL | INTRAMUSCULAR | Status: DC | PRN

## 2012-04-12 MED ORDER — LIDOCAINE HCL (CARDIAC) 20 MG/ML IV SOLN
INTRAVENOUS | Status: AC
  Filled 2012-04-12: qty 5

## 2012-04-12 MED ORDER — NALOXONE HCL 0.4 MG/ML IJ SOLN: 0.4000 mg | INTRAMUSCULAR | Status: DC | PRN

## 2012-04-12 MED ORDER — ROCURONIUM BROMIDE 50 MG/5ML IV SOLN
INTRAVENOUS | Status: AC
  Filled 2012-04-12: qty 1

## 2012-04-12 MED ORDER — HYDROMORPHONE HCL PF 1 MG/ML IJ SOLN
0.2500 mg | INTRAMUSCULAR | Status: DC | PRN
  Administered 2012-04-12 (×2): 0.5 mg via INTRAVENOUS

## 2012-04-12 MED ORDER — ONDANSETRON HCL 4 MG/2ML IJ SOLN
INTRAMUSCULAR | Status: DC | PRN
  Administered 2012-04-12: 4 mg via INTRAVENOUS

## 2012-04-12 MED ORDER — EPHEDRINE 5 MG/ML INJ
INTRAVENOUS | Status: AC
  Filled 2012-04-12: qty 10

## 2012-04-12 MED ORDER — HYDROMORPHONE 0.3 MG/ML IV SOLN
INTRAVENOUS | Status: DC
  Administered 2012-04-12: 3.59 mg via INTRAVENOUS
  Administered 2012-04-12: 18:00:00 via INTRAVENOUS
  Administered 2012-04-13: 2.59 mg via INTRAVENOUS
  Administered 2012-04-13: 0.999 mg via INTRAVENOUS
  Filled 2012-04-12: qty 25

## 2012-04-12 MED ORDER — TEMAZEPAM 15 MG PO CAPS
15.0000 mg | ORAL_CAPSULE | Freq: Every evening | ORAL | Status: DC | PRN
  Administered 2012-04-13: 15 mg via ORAL
  Filled 2012-04-12: qty 1

## 2012-04-12 MED ORDER — PROPOFOL 10 MG/ML IV EMUL
INTRAVENOUS | Status: DC | PRN
  Administered 2012-04-12: 180 mg via INTRAVENOUS

## 2012-04-12 MED ORDER — GLYCOPYRROLATE 0.2 MG/ML IJ SOLN
INTRAMUSCULAR | Status: AC
  Filled 2012-04-12: qty 1

## 2012-04-12 MED ORDER — LIDOCAINE-EPINEPHRINE (PF) 1 %-1:200000 IJ SOLN
INTRAMUSCULAR | Status: AC
  Filled 2012-04-12: qty 10

## 2012-04-12 MED ORDER — KETOROLAC TROMETHAMINE 30 MG/ML IJ SOLN: 15.0000 mg | Freq: Once | INTRAMUSCULAR | Status: DC | PRN

## 2012-04-12 MED ORDER — OXYCODONE-ACETAMINOPHEN 5-325 MG PO TABS
1.0000 | ORAL_TABLET | ORAL | Status: DC | PRN
  Administered 2012-04-13: 2 via ORAL
  Administered 2012-04-13: 1 via ORAL
  Administered 2012-04-13: 2 via ORAL
  Filled 2012-04-12: qty 2
  Filled 2012-04-12: qty 1
  Filled 2012-04-12: qty 2

## 2012-04-12 MED ORDER — DEXAMETHASONE SODIUM PHOSPHATE 10 MG/ML IJ SOLN
INTRAMUSCULAR | Status: DC | PRN
  Administered 2012-04-12: 10 mg via INTRAVENOUS

## 2012-04-12 MED ORDER — GLYCOPYRROLATE 0.2 MG/ML IJ SOLN
INTRAMUSCULAR | Status: DC | PRN
  Administered 2012-04-12: 0.4 mg via INTRAVENOUS

## 2012-04-12 MED ORDER — EPHEDRINE SULFATE 50 MG/ML IJ SOLN
INTRAMUSCULAR | Status: DC | PRN
  Administered 2012-04-12 (×2): 10 mg via INTRAVENOUS

## 2012-04-12 MED ORDER — LIDOCAINE-EPINEPHRINE (PF) 1 %-1:200000 IJ SOLN
INTRAMUSCULAR | Status: DC | PRN
  Administered 2012-04-12: 15 mL

## 2012-04-12 MED ORDER — LIDOCAINE HCL (CARDIAC) 20 MG/ML IV SOLN
INTRAVENOUS | Status: DC | PRN
  Administered 2012-04-12: 50 mg via INTRAVENOUS

## 2012-04-12 MED ORDER — KETOROLAC TROMETHAMINE 30 MG/ML IJ SOLN: 30.0000 mg | Freq: Four times a day (QID) | INTRAMUSCULAR | Status: DC

## 2012-04-12 MED ORDER — NEOSTIGMINE METHYLSULFATE 1 MG/ML IJ SOLN
INTRAMUSCULAR | Status: AC
  Filled 2012-04-12: qty 10

## 2012-04-12 MED ORDER — NEOSTIGMINE METHYLSULFATE 1 MG/ML IJ SOLN
INTRAMUSCULAR | Status: DC | PRN
  Administered 2012-04-12: 2 mg via INTRAVENOUS

## 2012-04-12 MED ORDER — LACTATED RINGERS IV SOLN
INTRAVENOUS | Status: DC
  Administered 2012-04-12 (×2): via INTRAVENOUS

## 2012-04-12 MED ORDER — SCOPOLAMINE 1 MG/3DAYS TD PT72
MEDICATED_PATCH | TRANSDERMAL | Status: AC
  Administered 2012-04-12: 1.5 mg via TRANSDERMAL
  Filled 2012-04-12: qty 1

## 2012-04-12 MED ORDER — MIDAZOLAM HCL 5 MG/5ML IJ SOLN
INTRAMUSCULAR | Status: DC | PRN
  Administered 2012-04-12: 2 mg via INTRAVENOUS

## 2012-04-12 MED ORDER — ONDANSETRON HCL 4 MG/2ML IJ SOLN
INTRAMUSCULAR | Status: AC
  Filled 2012-04-12: qty 2

## 2012-04-12 MED ORDER — KETOROLAC TROMETHAMINE 30 MG/ML IJ SOLN
INTRAMUSCULAR | Status: DC | PRN
  Administered 2012-04-12: 30 mg via INTRAVENOUS

## 2012-04-12 MED ORDER — ONDANSETRON HCL 4 MG PO TABS
4.0000 mg | ORAL_TABLET | Freq: Four times a day (QID) | ORAL | Status: DC | PRN
  Administered 2012-04-13: 4 mg via ORAL
  Filled 2012-04-12: qty 1

## 2012-04-12 MED ORDER — FENTANYL CITRATE 0.05 MG/ML IJ SOLN
INTRAMUSCULAR | Status: AC
  Filled 2012-04-12: qty 5

## 2012-04-12 MED ORDER — LACTATED RINGERS IV SOLN
INTRAVENOUS | Status: DC
  Administered 2012-04-12 (×3): via INTRAVENOUS

## 2012-04-12 MED ORDER — ESTRADIOL 0.1 MG/GM VA CREA
TOPICAL_CREAM | VAGINAL | Status: DC | PRN
  Administered 2012-04-12: 1 via VAGINAL

## 2012-04-12 MED ORDER — ESTRADIOL 0.1 MG/GM VA CREA
TOPICAL_CREAM | VAGINAL | Status: AC
  Filled 2012-04-12: qty 42.5

## 2012-04-12 MED ORDER — DEXAMETHASONE SODIUM PHOSPHATE 10 MG/ML IJ SOLN
INTRAMUSCULAR | Status: AC
  Filled 2012-04-12: qty 1

## 2012-04-12 MED ORDER — HYDROMORPHONE HCL PF 1 MG/ML IJ SOLN
INTRAMUSCULAR | Status: DC | PRN
  Administered 2012-04-12: 1 mg via INTRAVENOUS

## 2012-04-12 SURGICAL SUPPLY — 23 items
CLOTH BEACON ORANGE TIMEOUT ST (SAFETY) ×3 IMPLANT
ELECT LIGASURE SHORT 9 REUSE (ELECTRODE) ×1 IMPLANT
GAUZE PACKING 1 X5 YD ST (GAUZE/BANDAGES/DRESSINGS) ×1 IMPLANT
GLOVE BIO SURGEON STRL SZ 6.5 (GLOVE) ×4 IMPLANT
GLOVE BIO SURGEON STRL SZ7 (GLOVE) ×1 IMPLANT
GLOVE BIOGEL PI IND STRL 7.0 (GLOVE) ×4 IMPLANT
GLOVE BIOGEL PI INDICATOR 7.0 (GLOVE) ×3
GLOVE ECLIPSE 6.0 STRL STRAW (GLOVE) ×3 IMPLANT
GLOVE ECLIPSE 6.5 STRL STRAW (GLOVE) ×1 IMPLANT
GOWN PREVENTION PLUS LG XLONG (DISPOSABLE) ×12 IMPLANT
GOWN STRL REIN XL XLG (GOWN DISPOSABLE) ×4 IMPLANT
NS IRRIG 1000ML POUR BTL (IV SOLUTION) ×3 IMPLANT
PACK VAGINAL WOMENS (CUSTOM PROCEDURE TRAY) ×3 IMPLANT
SUT VIC AB 0 CT1 18XCR BRD8 (SUTURE) ×4 IMPLANT
SUT VIC AB 0 CT1 36 (SUTURE) ×3 IMPLANT
SUT VIC AB 0 CT1 8-18 (SUTURE) ×6
SUT VIC AB 2-0 CT1 27 (SUTURE) ×6
SUT VIC AB 2-0 CT1 TAPERPNT 27 (SUTURE) ×4 IMPLANT
SUT VICRYL 0 TIES 12 18 (SUTURE) ×3 IMPLANT
TOWEL OR 17X24 6PK STRL BLUE (TOWEL DISPOSABLE) ×6 IMPLANT
TRAY FOLEY CATH 14FR (SET/KITS/TRAYS/PACK) ×3 IMPLANT
WATER STERILE IRR 1000ML POUR (IV SOLUTION) ×3 IMPLANT
YANKAUER SUCT BULB TIP NO VENT (SUCTIONS) ×1 IMPLANT

## 2012-04-12 NOTE — Op Note (Signed)
Procedure: Transvaginal hysterectomy and bilateral salpingo-oophorectomy Preoperative diagnosis: Pelvic pain Postop diagnosis: Same Surgeon: Dr. Scheryl Darter Anesthesia: Gen. endotracheal Estimated blood loss: 75 mL Specimen: Uterus and bilateral fallopian tubes and ovaries Complications: None Drain: Foley catheter Counts:correct  Patient gave written consent for transvaginal hysterectomy and bilateral salpingo-oophorectomy. She had intractable pelvic pain. She had an endometrial ablation which resulted in amenorrhea but she still had cyclic pain. She requested bilateral salpingo-oophorectomy in addition to hysterectomy. Patient identification was confirmed and she was brought to the operating room and general anesthesia was induced. She's placed in dorsal lithotomy position. Perineum and vagina were sterilely prepped and draped. A Foley catheter was placed. Exam revealed normal-size uterus no adnexal masses. Weighted speculum was placed. 1% lidocaine with 1 200,000 epinephrine was infiltrated around the cervix. The cervix was grasped with 2 double-tooth tenacula. #10 blade was used to make an incision around the cervix. The vaginal mucosa was pushed back off the cervix. The posterior cul-de-sac was entered with Metzenbaum scissors. The long weighted speculum was placed. The uterosacral ligaments were clamped with Heaney clamps and cut and ligated with 0 Vicryl. The anterior peritoneum was opened sharply and the bladder was retracted. The LigaSure was used to coagulate the cardinal ligaments and these were cut. Likewise the uterine vessels were cauterized and cut with good hemostasis. The adnexal pedicles were partially cut using cautery. Double clamps were then placed across the adnexal pedicles and the specimen was removed. Double ligatures were placed on the adnexal pedicles. The ovaries and tubes appeared normal except for approximately 1 cm cyst on the right ovary. Heaney clamps were placed across  the infundibulopelvic ligaments and the tubes and ovaries were removed on both sides. Double ligatures were 0 Vicryl were placed and good hemostasis was seen. 0 Vicryl using interrupted figure-of-eight's was used to close the vaginal cuff and peritoneum. The hemostasis was seen. Vaginal packing with Estrace cream was placed. Patient tolerated the procedure well without complications. She was brought in stable condition to PACU.  Myrta Mercer 04/12/2012 3:10 PM

## 2012-04-12 NOTE — Anesthesia Postprocedure Evaluation (Signed)
Anesthesia Post Note  Patient: Katelyn [name]  Procedure(s) Performed: Procedure(s) (LRB): HYSTERECTOMY VAGINAL (N/A) SALPINGO OOPHERECTOMY (Bilateral)  Anesthesia type: GA  Patient location: PACU  Post pain: Pain level controlled  Post assessment: Post-op Vital signs reviewed  Last Vitals:  Filed Vitals:   04/12/12 1515  BP: 109/65  Pulse: 73  Temp:   Resp: 16    Post vital signs: Reviewed  Level of consciousness: sedated  Complications: No apparent anesthesia complications

## 2012-04-12 NOTE — H&P (Signed)
[id number]  Patient's last menstrual period was 10/28/2011.  States ready for gland to be removed on left and hysterectomy- states tired of dealing with this- has had 2 marsupilizations, and repeated visits. States has pains In ovary/pelvic with pains that shoot downward.  She does not bleed after her endometrial ablation she still has cyclic pain which is disabling. Laparoscopy in November did not demonstrate any endometriosis. She declined Lupron in January. She now feels as though a hysterectomy and bilateral salpingo-oophorectomy would necessary to alleviate her cyclic pain. She asked to have the word catheter removed from the Bartholin's abscess that was drained April 15.  pmh  Past Surgical History   Procedure  Date   .  Tubal ligation  2007   .  Tonsillectomy    .  Bartholin cyst marsupialization  11/2010   .  Wisdom tooth extraction  age 49   .  Novasure ablation  10/28/2011     Procedure: NOVASURE ABLATION; Surgeon: Scheryl Darter, MD; Location: [facility]; Service: Gynecology; Laterality: N/A;   .  Bartholin cyst marsupialization  10/28/2011     Procedure: BARTHOLIN CYST MARSUPIALIZATION; Surgeon: Scheryl Darter, MD; Location: [facility]; Service: Gynecology; Laterality: N/A;   .  Laparoscopy  10/28/2011     Procedure: LAPAROSCOPY DIAGNOSTIC; Surgeon: Scheryl Darter, MD; Location: [facility]; Service: Gynecology; Laterality: N/A;   .  Incision and drain bartholin cyst  02/2012    Current Outpatient Prescriptions on File Prior to Visit   Medication  Sig  Dispense  Refill   .  Multiple Vitamins-Minerals (WOMENS MULTIVITAMIN PLUS PO)  Take 1 tablet by mouth at bedtime.     .  lidocaine (XYLOCAINE JELLY) 2 % jelly  Apply topically as needed.  30 mL  0   .  promethazine (PHENERGAN) 25 MG tablet  Take 1 tablet (25 mg total) by mouth every 6 (six) hours as needed for nausea.  20 tablet  0   .  pseudoephedrine (SUDAFED) 30 MG tablet  Take 30 mg by mouth every 4 (four) hours as needed. For allergies/congestion       Allergies   Allergen  Reactions   .  Amoxicillin-Pot Clavulanate  Other (See Comments)     Yeast infection.   .  Oxycontin (Oxycodone Hcl Er)  Itching   .  Vicodin (Hydrocodone-Acetaminophen)  Other (See Comments)     Hyper ("jumping up and down")    History    Social History   .  Marital Status:  Single     Spouse Name:  N/A     Number of Children:  N/A   .  Years of Education:  N/A    Occupational History   .  Not on file.    Social History Main Topics   .  Smoking status:  Current Everyday Smoker -- 0.2 packs/day for 8 years     Types:  Cigarettes   .  Smokeless tobacco:  Never Used   .  Alcohol Use:  No      socially rarely   .  Drug Use:  No   .  Sexually Active:  Not Currently     Birth Control/ Protection:  Surgical      [pt states hasn't had sex in more than 2 months    Other Topics  Concern   .  Not on file    Social History Narrative   .  No narrative on file    Family History   Problem  Relation  Age of Onset   .  Hypertension  Mother    .  Hyperlipidemia  Mother    .  Depression  Mother    .  Heart disease  Father    .  Anesthesia problems  Neg Hx     Review of systems: Cyclic pelvic pain. No vaginal bleeding or discharge. Irrigation from the Word catheter. Nausea vomiting. No dysuria. She has pain with intercourse and has avoided intercourse for last 2 months.  Filed Vitals:    03/29/12 1600   BP:  129/82   Pulse:  83   Temp:  98.2 F (36.8 C)   TempSrc:  Oral   Height:  5' 1.25" (1.556 m)   Weight:  121 lb (54.885 kg)    Filed Vitals:   04/12/12 1202  BP: 106/70  Pulse: 118  Temp: 98.8 F (37.1 C)  TempSrc: Oral  Resp: 18  SpO2: 98%    Chest clear, Heart RRR Patient becomes anxious and tearful during conversation.  Abdomen flat soft nontender no masses  Pelvic: External genitalia showed the presence of the Word catheter on the left side. This was cut and drained and removed. There is a small amount of mucous discharge from the  opening of the gland. Exam was otherwise deferred today.  CBC    Component Value Date/Time   WBC 9.4 04/05/2012 1423   RBC 5.12* 04/05/2012 1423   HGB 14.0 04/05/2012 1423   HCT 43.0 04/05/2012 1423   PLT 227 04/05/2012 1423   MCV 84.0 04/05/2012 1423   MCH 27.3 04/05/2012 1423   MCHC 32.6 04/05/2012 1423   RDW 14.4 04/05/2012 1423   LYMPHSABS 2.0 08/27/2011 1250   MONOABS 0.8 08/27/2011 1250   EOSABS 0.0 08/27/2011 1250   BASOSABS 0.0 08/27/2011 1250     Operative findings from November 2012 were reviewed.  Impression cyclic pelvic pain which has not improved after endometrial ablation. Endometriosis was not seen at the time of diagnostic laparoscopy. Patient has missed work and school because of the symptoms. She would like to have definitive therapy and have a hysterectomy and removal of her tubes and ovaries. Discussed that this still may not take away all of her pain. She would like to schedule the procedure. We'll schedule transvaginal hysterectomy and bilateral salpingo-oophorectomy. The procedure and the risks of continued pain were discussed. The risk of bleeding infection anesthesia complications, bowel or urinary tract damage were discussed. Her questions were answered. The vulvar cyst is not currently symptomatic and excision may not be performed at the time of surgery. Dr. Scheryl Darter  04/12/2012 12:27 PM

## 2012-04-12 NOTE — Anesthesia Preprocedure Evaluation (Addendum)
Anesthesia Evaluation  Patient identified by MRN, date of birth, ID band Patient awake    Reviewed: Allergy & Precautions, H&P , NPO status , Patient's Chart, lab work & pertinent test results, reviewed documented beta blocker date and time   History of Anesthesia Complications (+) PONV  Airway Mallampati: I TM Distance: >3 FB Neck ROM: full    Dental  (+) Poor Dentition No teeth loose.  Decay on molars:   Pulmonary Current Smoker,  breath sounds clear to auscultation  Pulmonary exam normal       Cardiovascular Exercise Tolerance: Good negative cardio ROS  Rhythm:regular Rate:Normal     Neuro/Psych PSYCHIATRIC DISORDERS (anxiety) negative neurological ROS     GI/Hepatic negative GI ROS, Neg liver ROS,   Endo/Other  negative endocrine ROS  Renal/GU negative Renal ROS  Female GU complaint     Musculoskeletal   Abdominal   Peds  Hematology negative hematology ROS (+)   Anesthesia Other Findings   Reproductive/Obstetrics negative OB ROS                          Anesthesia Physical Anesthesia Plan  ASA: II  Anesthesia Plan: General ETT   Post-op Pain Management:    Induction:   Airway Management Planned:   Additional Equipment:   Intra-op Plan:   Post-operative Plan:   Informed Consent: I have reviewed the patients History and Physical, chart, labs and discussed the procedure including the risks, benefits and alternatives for the proposed anesthesia with the patient or authorized representative who has indicated his/her understanding and acceptance.   Dental Advisory Given  Plan Discussed with: CRNA and Surgeon  Anesthesia Plan Comments:         Anesthesia Quick Evaluation

## 2012-04-12 NOTE — Transfer of Care (Signed)
Immediate Anesthesia Transfer of Care Note  Patient: Katelyn [name]  Procedure(s) Performed: Procedure(s) (LRB): HYSTERECTOMY VAGINAL (N/A) SALPINGO OOPHERECTOMY (Bilateral)  Patient Location: PACU  Anesthesia Type: General  Level of Consciousness: sedated  Airway & Oxygen Therapy: Patient Spontanous Breathing  Post-op Assessment: Report given to PACU RN  Post vital signs: Reviewed and stable  Complications: No apparent anesthesia complications

## 2012-04-13 ENCOUNTER — Encounter (HOSPITAL_COMMUNITY): Payer: Self-pay | Admitting: *Deleted

## 2012-04-13 LAB — CBC
Platelets: 149 10*3/uL — ABNORMAL LOW (ref 150–400)
RDW: 14.5 % (ref 11.5–15.5)
WBC: 13.1 10*3/uL — ABNORMAL HIGH (ref 4.0–10.5)

## 2012-04-13 MED ORDER — ONDANSETRON HCL 4 MG PO TABS: 4.0000 mg | ORAL_TABLET | Freq: Four times a day (QID) | ORAL | Status: AC | PRN

## 2012-04-13 MED ORDER — PNEUMOCOCCAL VAC POLYVALENT 25 MCG/0.5ML IJ INJ
0.5000 mL | INJECTION | Freq: Once | INTRAMUSCULAR | Status: AC
  Administered 2012-04-13: 0.5 mL via INTRAMUSCULAR
  Filled 2012-04-13: qty 0.5

## 2012-04-13 MED ORDER — OXYCODONE-ACETAMINOPHEN 5-325 MG PO TABS: 1.0000 | ORAL_TABLET | Freq: Four times a day (QID) | ORAL | Status: DC | PRN

## 2012-04-13 NOTE — Addendum Note (Signed)
Addendum  created 04/13/12 1610 by Renford Dills, CRNA   Modules edited:Notes Section

## 2012-04-13 NOTE — Progress Notes (Signed)
Pt discharged to home with fiancee. Condition stable.  Pt ambulated to car with Dolphus Jenny, NT.  No equipment for home ordered at discharge.
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
## 2012-04-13 NOTE — Progress Notes (Signed)
Pt had cigarettes and lighter at bedside. Katelyn [name], NT asked pt if pt wanted to put cigarettes in her pocket book or bring to desk. Pt agreed to allow NT to bring them to desk and put in chart. Pt was asked if cigarettes could be sent home with fiance, she agreed to allow fiance to take cigarettes home with him.

## 2012-04-13 NOTE — Anesthesia Postprocedure Evaluation (Signed)
  Anesthesia Post-op Note  Patient: Katelyn [name]  Procedure(s) Performed: Procedure(s) (LRB): HYSTERECTOMY VAGINAL (N/A) SALPINGO OOPHERECTOMY (Bilateral)  Patient Location: [facility]  Anesthesia Type: General  Level of Consciousness: awake  Airway and Oxygen Therapy: Patient Spontanous Breathing  Post-op Pain: moderate  Post-op Assessment: Patient's Cardiovascular Status Stable and Respiratory Function Stable  Post-op Vital Signs: stable  Complications: No apparent anesthesia complications

## 2012-04-13 NOTE — Discharge Instructions (Signed)
Hysterectomy Care After Refer to this sheet in the next few weeks. These instructions provide you with information on caring for yourself after your procedure. Your caregiver may also give you more specific instructions. Your treatment has been planned according to current medical practices, but problems sometimes occur. Call your caregiver if you have any problems or questions after your procedure. HOME CARE INSTRUCTIONS  Healing will take time. You may have discomfort, tenderness, swelling, and bruising at the surgical site for about 2 weeks. This is normal and will get better as time goes on.  Only take over-the-counter or prescription medicines for pain, discomfort, or fever as directed by your caregiver.   Do not take aspirin. It can cause bleeding.   Do not drive when taking pain medicine.   Follow your caregiver's advice regarding exercise, lifting, driving, and general activities.   Resume your usual diet as directed and allowed.   Get plenty of rest and sleep.   Do not douche, use tampons, or have sexual intercourse for at least 6 weeks or until your caregiver gives you permission.   Change your bandages (dressings) as directed by your caregiver.   Monitor your temperature.   Take showers instead of baths for 2 to 3 weeks.   Do not drink alcohol until your caregiver gives you permission.   If you are constipated, you may take a mild laxative with your caregiver's permission. Bran foods may help with constipation problems. Drinking enough fluids to keep your urine clear or pale yellow may help as well.   Try to have someone home with you for 1 or 2 weeks to help around the house.   Keep all of your follow-up appointments as directed by your caregiver.  SEEK MEDICAL CARE IF:   You have swelling, redness, or increasing pain in the surgical cut (incision) area.   You have pus coming from the incision.   You notice a bad smell coming from the incision or dressing.   You  have swelling, redness, or pain around the intravenous (IV) site.   Your incision breaks open.   You feel dizzy or lightheaded.   You have pain or bleeding when you urinate.   You have persistent diarrhea.   You have persistent nausea and vomiting.   You have abnormal vaginal discharge.   You have a rash.   You have any type of abnormal reaction or develop an allergy to your medicine.   Your pain is not controlled with your prescribed medicine.  SEEK IMMEDIATE MEDICAL CARE IF:   You have a fever.   You have severe abdominal pain.   You have chest pain.   You have shortness of breath.   You faint.   You have pain, swelling, or redness of your leg.   You have heavy vaginal bleeding with blood clots.  MAKE SURE YOU:  Understand these instructions.   Will watch your condition.   Will get help right away if you are not doing well or get worse.  Document Released: 06/03/2005 Document Revised: 11/03/2011 Document Reviewed: 07/01/2011 Exit[employer] Patient Information 2012 [employer]. 

## 2012-04-13 NOTE — Progress Notes (Signed)
Pt request that PCA be shut off so she could leave the floor. Called [place], CNM and was given orders to discontinue PCA and begin giving pt percocet. Pt okay with plan of care.

## 2012-04-14 ENCOUNTER — Encounter (HOSPITAL_COMMUNITY): Payer: Self-pay | Admitting: Obstetrics & Gynecology

## 2012-04-14 NOTE — Discharge Summary (Signed)
Physician Discharge Summary  Patient ID: EUTHA CUDE MRN: [id number] DOB/AGE: Apr 22, 1986 26 y.o.  Admit date: 04/12/2012 Discharge date: 04/14/2012  Admission Diagnoses:  Discharge Diagnoses:  Active Problems:  * No active hospital problems. *    Discharged Condition: good  Hospital Course: routine post-op  Consults: None  Significant Diagnostic Studies:   Treatments: surgery: TVH/BSO  Discharge Exam: Blood pressure 101/62, pulse 61, temperature 97.4 F (36.3 C), temperature source Oral, resp. rate 18, height 5' 1.25" (1.556 m), weight 53.524 kg (118 lb), SpO2 99.00%. General appearance: alert, cooperative and no distress GI: soft, non-tender; bowel sounds normal; no masses,  no organomegaly  Disposition: 01-Home or Self Care   Medication List  As of 04/14/2012 11:44 AM   TAKE these medications         ALPRAZolam 0.5 MG tablet   Commonly known as: XANAX   Take 1 tablet (0.5 mg total) by mouth 2 (two) times daily as needed for sleep or anxiety.      cetirizine-pseudoephedrine 5-120 MG per tablet   Commonly known as: ZYRTEC-D   Take 1 tablet by mouth 2 (two) times daily as needed. Prn allergies      diclofenac 75 MG EC tablet   Commonly known as: VOLTAREN   Take 1 tablet (75 mg total) by mouth 2 (two) times daily.      ondansetron 4 MG tablet   Commonly known as: ZOFRAN   Take 1 tablet (4 mg total) by mouth every 6 (six) hours as needed for nausea.      oxyCODONE-acetaminophen 5-325 MG per tablet   Commonly known as: PERCOCET   Take 1-2 tablets by mouth every 6 (six) hours as needed for pain.      tri-vitamin 1500-400-35 Soln   Take by mouth daily.           Follow-up Information    Follow up with [facility] in 4 weeks.         Signed: Raissa Dam 04/14/2012, 11:44 AM

## 2012-04-17 ENCOUNTER — Telehealth: Payer: Self-pay | Admitting: *Deleted

## 2012-04-17 ENCOUNTER — Ambulatory Visit (INDEPENDENT_AMBULATORY_CARE_PROVIDER_SITE_OTHER): Payer: Medicaid Other | Admitting: Family Medicine

## 2012-04-17 DIAGNOSIS — F418 Other specified anxiety disorders: Secondary | ICD-10-CM

## 2012-04-17 DIAGNOSIS — Z09 Encounter for follow-up examination after completed treatment for conditions other than malignant neoplasm: Secondary | ICD-10-CM

## 2012-04-17 DIAGNOSIS — F411 Generalized anxiety disorder: Secondary | ICD-10-CM

## 2012-04-17 MED ORDER — ALPRAZOLAM 0.5 MG PO TABS: 0.5000 mg | ORAL_TABLET | Freq: Two times a day (BID) | ORAL | Status: AC | PRN

## 2012-04-17 MED ORDER — OXYCODONE-ACETAMINOPHEN 5-325 MG PO TABS: 1.0000 | ORAL_TABLET | Freq: Four times a day (QID) | ORAL | Status: AC | PRN

## 2012-04-17 MED ORDER — DICLOFENAC SODIUM 75 MG PO TBEC: 75.0000 mg | DELAYED_RELEASE_TABLET | Freq: Two times a day (BID) | ORAL | Status: DC

## 2012-04-17 NOTE — Patient Instructions (Signed)
Hysterectomy Care After Refer to this sheet in the next few weeks. These instructions provide you with information on caring for yourself after your procedure. Your caregiver may also give you more specific instructions. Your treatment has been planned according to current medical practices, but problems sometimes occur. Call your caregiver if you have any problems or questions after your procedure. HOME CARE INSTRUCTIONS  Healing will take time. You may have discomfort, tenderness, swelling, and bruising at the surgical site for about 2 weeks. This is normal and will get better as time goes on.  Only take over-the-counter or prescription medicines for pain, discomfort, or fever as directed by your caregiver.   Do not take aspirin. It can cause bleeding.   Do not drive when taking pain medicine.   Follow your caregiver's advice regarding exercise, lifting, driving, and general activities.   Resume your usual diet as directed and allowed.   Get plenty of rest and sleep.   Do not douche, use tampons, or have sexual intercourse for at least 6 weeks or until your caregiver gives you permission.   Change your bandages (dressings) as directed by your caregiver.   Monitor your temperature.   Take showers instead of baths for 2 to 3 weeks.   Do not drink alcohol until your caregiver gives you permission.   If you are constipated, you may take a mild laxative with your caregiver's permission. Bran foods may help with constipation problems. Drinking enough fluids to keep your urine clear or pale yellow may help as well.   Try to have someone home with you for 1 or 2 weeks to help around the house.   Keep all of your follow-up appointments as directed by your caregiver.  SEEK MEDICAL CARE IF:   You have swelling, redness, or increasing pain in the surgical cut (incision) area.   You have pus coming from the incision.   You notice a bad smell coming from the incision or dressing.   You  have swelling, redness, or pain around the intravenous (IV) site.   Your incision breaks open.   You feel dizzy or lightheaded.   You have pain or bleeding when you urinate.   You have persistent diarrhea.   You have persistent nausea and vomiting.   You have abnormal vaginal discharge.   You have a rash.   You have any type of abnormal reaction or develop an allergy to your medicine.   Your pain is not controlled with your prescribed medicine.  SEEK IMMEDIATE MEDICAL CARE IF:   You have a fever.   You have severe abdominal pain.   You have chest pain.   You have shortness of breath.   You faint.   You have pain, swelling, or redness of your leg.   You have heavy vaginal bleeding with blood clots.  MAKE SURE YOU:  Understand these instructions.   Will watch your condition.   Will get help right away if you are not doing well or get worse.  Document Released: 06/03/2005 Document Revised: 11/03/2011 Document Reviewed: 07/01/2011 [employer] Patient Information 2012 [employer]. 

## 2012-04-17 NOTE — Telephone Encounter (Signed)
I spoke w/pt and informed her that Dr. Shawnie Pons will prescribe the Percocet. I will call her back when the prescription is ready to be picked up.  Pt again stated that she can hardly move without having severe abdominal pain. She did not have this much pain right after the surgery. I asked if pt would like to be seen and evaluated by the doctor today. She said yes. She will come to the clinic today @ 1430 for evaluation. Dr. Shawnie Pons notified.

## 2012-04-17 NOTE — Telephone Encounter (Addendum)
Pt left message yesterday stating that she had hysterectomy on 5/16.  She is requesting pain med refill because she thinks she moved too quickly yesterday and may have pulled a muscle. Prior to that event, she states that she was doing pretty well in terms of not much pain.  I consulted with Dr. Shawnie Pons and received medication order. I called pt and informed her that she may pick up her Rx later today. She asked what medication was prescribed and when I told her Vicodin, she stated that she gets itching from that med. She said "Dr. Debroah Loop always gives me Percocet and since I twisted or moved wrong, I have been having to take 2 pills at a time.  I told pt that I will call her back again after speaking with the doctor on call.

## 2012-04-17 NOTE — Progress Notes (Signed)
Pt states that she has been having severe abdominal pain after "pulling a muscle" the other Katelyn [name]. She is requesting pain med refill.

## 2012-04-18 NOTE — Progress Notes (Signed)
Patient ID: Katelyn [name], female   DOB: 08-30-1986, 26 y.o.   MRN: [id number] History S/p TVH and now with increasing pain.  Out of pain meds.  Out of anti-anxiety meds  Physical exam Abdomen is soft and without guarding  Assessment nml post op  Plan Refilled meds for her.

## 2012-04-19 ENCOUNTER — Encounter (HOSPITAL_COMMUNITY): Payer: Self-pay | Admitting: Emergency Medicine

## 2012-04-19 ENCOUNTER — Emergency Department (HOSPITAL_COMMUNITY)
Admission: EM | Admit: 2012-04-19 | Discharge: 2012-04-19 | Discharge status: Against Medical Advice | Payer: Medicaid Other | Attending: Emergency Medicine | Admitting: Emergency Medicine

## 2012-04-19 DIAGNOSIS — S0990XA Unspecified injury of head, initial encounter: Secondary | ICD-10-CM | POA: Insufficient documentation

## 2012-04-19 DIAGNOSIS — W19XXXA Unspecified fall, initial encounter: Secondary | ICD-10-CM | POA: Insufficient documentation

## 2012-04-19 NOTE — ED Notes (Signed)
Pt states she as she was getting out of the bath she slipped and hit the corner of the door  Pt has a small laceration noted to the left eyebrow  Bleeding controlled

## 2012-04-25 ENCOUNTER — Inpatient Hospital Stay (HOSPITAL_COMMUNITY)
Admission: AD | Admit: 2012-04-25 | Discharge: 2012-04-25 | Disposition: A | Discharge status: Home / Self Care | Payer: Medicaid Other | Source: Ambulatory Visit | Attending: Obstetrics & Gynecology | Admitting: Obstetrics & Gynecology

## 2012-04-25 ENCOUNTER — Encounter (HOSPITAL_COMMUNITY): Payer: Self-pay | Admitting: *Deleted

## 2012-04-25 DIAGNOSIS — N390 Urinary tract infection, site not specified: Secondary | ICD-10-CM | POA: Insufficient documentation

## 2012-04-25 DIAGNOSIS — R110 Nausea: Secondary | ICD-10-CM | POA: Insufficient documentation

## 2012-04-25 DIAGNOSIS — IMO0002 Reserved for concepts with insufficient information to code with codable children: Secondary | ICD-10-CM | POA: Insufficient documentation

## 2012-04-25 DIAGNOSIS — F411 Generalized anxiety disorder: Secondary | ICD-10-CM | POA: Insufficient documentation

## 2012-04-25 DIAGNOSIS — N9989 Other postprocedural complications and disorders of genitourinary system: Secondary | ICD-10-CM | POA: Insufficient documentation

## 2012-04-25 DIAGNOSIS — R109 Unspecified abdominal pain: Secondary | ICD-10-CM | POA: Insufficient documentation

## 2012-04-25 LAB — URINE MICROSCOPIC-ADD ON

## 2012-04-25 LAB — COMPREHENSIVE METABOLIC PANEL
Albumin: 4.3 g/dL (ref 3.5–5.2)
Alkaline Phosphatase: 98 U/L (ref 39–117)
BUN: 12 mg/dL (ref 6–23)
CO2: 27 mEq/L (ref 19–32)
Chloride: 99 mEq/L (ref 96–112)
Creatinine, Ser: 0.69 mg/dL (ref 0.50–1.10)
GFR calc non Af Amer: >90 mL/min (ref >90)
Glucose, Bld: 78 mg/dL (ref 70–99)
Potassium: 3.9 mEq/L (ref 3.5–5.1)
Total Bilirubin: 0.3 mg/dL (ref 0.3–1.2)

## 2012-04-25 LAB — URINALYSIS, ROUTINE W REFLEX MICROSCOPIC
Protein, ur: 100 mg/dL — AB
Specific Gravity, Urine: >1.03 — ABNORMAL HIGH (ref 1.005–1.030)
Urobilinogen, UA: 0.2 mg/dL (ref 0.0–1.0)
pH: 6 (ref 5.0–8.0)

## 2012-04-25 LAB — CBC
HCT: 41.3 % (ref 36.0–46.0)
Hemoglobin: 13.4 g/dL (ref 12.0–15.0)
MCV: 82.9 fL (ref 78.0–100.0)
RBC: 4.98 MIL/uL (ref 3.87–5.11)
RDW: 14.2 % (ref 11.5–15.5)
WBC: 16.8 10*3/uL — ABNORMAL HIGH (ref 4.0–10.5)

## 2012-04-25 MED ORDER — ESTRADIOL 0.1 MG/24HR TD PTWK: 1.0000 | MEDICATED_PATCH | TRANSDERMAL | Status: DC

## 2012-04-25 MED ORDER — CIPROFLOXACIN HCL 500 MG PO TABS: 500.0000 mg | ORAL_TABLET | Freq: Two times a day (BID) | ORAL | Status: AC

## 2012-04-25 MED ORDER — CIPROFLOXACIN HCL 500 MG PO TABS
500.0000 mg | ORAL_TABLET | Freq: Once | ORAL | Status: AC
  Administered 2012-04-25: 500 mg via ORAL
  Filled 2012-04-25: qty 1

## 2012-04-25 MED ORDER — OXYCODONE-ACETAMINOPHEN 5-325 MG PO TABS
2.0000 | ORAL_TABLET | Freq: Once | ORAL | Status: AC
  Administered 2012-04-25: 2 via ORAL
  Filled 2012-04-25: qty 2

## 2012-04-25 MED ORDER — ESTRADIOL 0.1 MG/24HR TD PTWK
0.1000 mg | MEDICATED_PATCH | Freq: Once | TRANSDERMAL | Status: DC
  Administered 2012-04-25: 0.1 mg via TRANSDERMAL
  Filled 2012-04-25: qty 1

## 2012-04-25 MED ORDER — OXYCODONE-ACETAMINOPHEN 5-325 MG PO TABS: 2.0000 | ORAL_TABLET | ORAL | Status: AC | PRN

## 2012-04-25 NOTE — MAU Note (Signed)
States she took her last percocet last night. Still has voltaren. Does not think voltaren or ibuprofen work as well.

## 2012-04-25 NOTE — Discharge Instructions (Signed)

## 2012-04-25 NOTE — MAU Note (Signed)
Patient states she had a vaginal hysterectomy on 5-16. States she started having lower abdominal and low back pain 2 days ago. Has had vomiting all night. Has a slight vaginal discharge with odor and burning with urination.
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
## 2012-05-03 ENCOUNTER — Ambulatory Visit: Payer: Medicaid Other | Admitting: Obstetrics & Gynecology

## 2012-05-28 ENCOUNTER — Ambulatory Visit (INDEPENDENT_AMBULATORY_CARE_PROVIDER_SITE_OTHER): Payer: Medicaid Other | Admitting: Obstetrics & Gynecology

## 2012-05-28 VITALS — BP 112/77 | HR 101 | Temp 97.7°F | Ht 61.0 in | Wt 108.3 lb

## 2012-05-28 DIAGNOSIS — Z09 Encounter for follow-up examination after completed treatment for conditions other than malignant neoplasm: Secondary | ICD-10-CM

## 2012-05-28 DIAGNOSIS — N949 Unspecified condition associated with female genital organs and menstrual cycle: Secondary | ICD-10-CM

## 2012-05-28 DIAGNOSIS — R102 Pelvic and perineal pain: Secondary | ICD-10-CM

## 2012-05-28 MED ORDER — ESTRADIOL 1 MG PO TABS: 2.0000 mg | ORAL_TABLET | Freq: Every day | ORAL | Status: DC
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
## 2012-05-28 NOTE — Progress Notes (Signed)
States not taking patches because they come off after about 2 days because of extensive sweating.  Also c/o problems with constipation- has used about 4 bottles of magnesium citrate.

## 2012-05-28 NOTE — Patient Instructions (Addendum)

## 2012-07-12 ENCOUNTER — Encounter (HOSPITAL_COMMUNITY): Payer: Self-pay

## 2012-08-09 ENCOUNTER — Inpatient Hospital Stay (HOSPITAL_COMMUNITY)
Admission: AD | Admit: 2012-08-09 | Discharge: 2012-08-09 | Disposition: A | Discharge status: Home / Self Care | Payer: Self-pay | Source: Ambulatory Visit | Attending: Obstetrics & Gynecology | Admitting: Obstetrics & Gynecology

## 2012-08-09 DIAGNOSIS — B3731 Acute candidiasis of vulva and vagina: Secondary | ICD-10-CM | POA: Insufficient documentation

## 2012-08-09 DIAGNOSIS — B373 Candidiasis of vulva and vagina: Secondary | ICD-10-CM | POA: Insufficient documentation

## 2012-08-09 DIAGNOSIS — B379 Candidiasis, unspecified: Secondary | ICD-10-CM

## 2012-08-09 DIAGNOSIS — N390 Urinary tract infection, site not specified: Secondary | ICD-10-CM | POA: Insufficient documentation

## 2012-08-09 DIAGNOSIS — R109 Unspecified abdominal pain: Secondary | ICD-10-CM | POA: Insufficient documentation

## 2012-08-09 DIAGNOSIS — R300 Dysuria: Secondary | ICD-10-CM | POA: Insufficient documentation

## 2012-08-09 LAB — URINALYSIS, ROUTINE W REFLEX MICROSCOPIC
Glucose, UA: NEGATIVE mg/dL
Ketones, ur: NEGATIVE mg/dL
Protein, ur: NEGATIVE mg/dL
Urobilinogen, UA: 0.2 mg/dL (ref 0.0–1.0)

## 2012-08-09 LAB — URINE MICROSCOPIC-ADD ON

## 2012-08-09 MED ORDER — CEFTRIAXONE SODIUM 1 G IJ SOLR
1.0000 g | Freq: Once | INTRAMUSCULAR | Status: AC
  Administered 2012-08-09: 1 g via INTRAMUSCULAR
  Filled 2012-08-09: qty 10

## 2012-08-09 MED ORDER — ONDANSETRON 8 MG PO TBDP: 8.0000 mg | ORAL_TABLET | Freq: Three times a day (TID) | ORAL | Status: DC | PRN

## 2012-08-09 MED ORDER — PHENAZOPYRIDINE HCL 200 MG PO TABS: 200.0000 mg | ORAL_TABLET | Freq: Three times a day (TID) | ORAL | Status: AC | PRN

## 2012-08-09 MED ORDER — FLUCONAZOLE 150 MG PO TABS: 150.0000 mg | ORAL_TABLET | Freq: Once | ORAL | Status: AC

## 2012-08-09 MED ORDER — SULFAMETHOXAZOLE-TMP DS 800-160 MG PO TABS: 1.0000 | ORAL_TABLET | Freq: Two times a day (BID) | ORAL | Status: AC

## 2012-08-09 NOTE — MAU Note (Signed)
Patient states she has been having abdominal and back pain with urination for about 2 days. Yellow vaginal discharge with irritation.
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
## 2012-08-09 NOTE — MAU Provider Note (Signed)
Attestation of Attending Supervision of Advanced Practitioner (CNM/NP): Evaluation and management procedures were performed by the Advanced Practitioner under my supervision and collaboration.  I have reviewed the Advanced Practitioner's note and chart, and I agree with the management and plan.  Avish Torry, MD, FACOG Attending Obstetrician & Gynecologist Faculty Practice, [facility]  

## 2012-10-18 ENCOUNTER — Encounter (HOSPITAL_COMMUNITY): Payer: Self-pay

## 2012-10-18 ENCOUNTER — Emergency Department (HOSPITAL_COMMUNITY)
Admission: EM | Admit: 2012-10-18 | Discharge: 2012-10-18 | Disposition: A | Discharge status: Home / Self Care | Payer: Self-pay | Attending: Emergency Medicine | Admitting: Emergency Medicine

## 2012-10-18 DIAGNOSIS — R040 Epistaxis: Secondary | ICD-10-CM | POA: Insufficient documentation

## 2012-10-18 DIAGNOSIS — D649 Anemia, unspecified: Secondary | ICD-10-CM | POA: Insufficient documentation

## 2012-10-18 DIAGNOSIS — Z8619 Personal history of other infectious and parasitic diseases: Secondary | ICD-10-CM | POA: Insufficient documentation

## 2012-10-18 DIAGNOSIS — Z8659 Personal history of other mental and behavioral disorders: Secondary | ICD-10-CM | POA: Insufficient documentation

## 2012-10-18 DIAGNOSIS — Z8742 Personal history of other diseases of the female genital tract: Secondary | ICD-10-CM | POA: Insufficient documentation

## 2012-10-18 DIAGNOSIS — Z8739 Personal history of other diseases of the musculoskeletal system and connective tissue: Secondary | ICD-10-CM | POA: Insufficient documentation

## 2012-10-18 DIAGNOSIS — F909 Attention-deficit hyperactivity disorder, unspecified type: Secondary | ICD-10-CM | POA: Insufficient documentation

## 2012-10-18 LAB — COMPREHENSIVE METABOLIC PANEL
ALT: 15 U/L (ref 0–35)
AST: 21 U/L (ref 0–37)
Albumin: 4.1 g/dL (ref 3.5–5.2)
Alkaline Phosphatase: 71 U/L (ref 39–117)
CO2: 26 mEq/L (ref 19–32)
Chloride: 101 mEq/L (ref 96–112)
Creatinine, Ser: 0.79 mg/dL (ref 0.50–1.10)
GFR calc non Af Amer: >90 mL/min (ref >90)
Potassium: 4 mEq/L (ref 3.5–5.1)
Sodium: 138 mEq/L (ref 135–145)
Total Bilirubin: 0.2 mg/dL — ABNORMAL LOW (ref 0.3–1.2)

## 2012-10-18 LAB — CBC WITH DIFFERENTIAL/PLATELET
Basophils Absolute: 0 10*3/uL (ref 0.0–0.1)
Basophils Relative: 0 % (ref 0–1)
HCT: 41 % (ref 36.0–46.0)
Lymphocytes Relative: 42 % (ref 12–46)
MCHC: 33.7 g/dL (ref 30.0–36.0)
Neutro Abs: 4.2 10*3/uL (ref 1.7–7.7)
Neutrophils Relative %: 44 % (ref 43–77)
Platelets: 178 10*3/uL (ref 150–400)
RDW: 14.3 % (ref 11.5–15.5)
WBC: 9.7 10*3/uL (ref 4.0–10.5)

## 2012-10-18 LAB — APTT: aPTT: 35 seconds (ref 24–37)

## 2012-10-18 LAB — PROTIME-INR: INR: 0.87 (ref 0.00–1.49)

## 2012-10-18 MED ORDER — IBUPROFEN 200 MG PO TABS
400.0000 mg | ORAL_TABLET | Freq: Once | ORAL | Status: AC
  Administered 2012-10-18: 400 mg via ORAL
  Filled 2012-10-18: qty 2
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
## 2012-10-18 NOTE — ED Notes (Signed)
Patient reports that she has had several episodes of nosebleeds in the past week. Patient reports that her nose bleeds for up to 20 minutes at a time prior to stopping. Patient reports that after her nose bleeds she has blurred vision and headaches. Patient reports that she has a history of anemia.

## 2012-11-12 ENCOUNTER — Other Ambulatory Visit: Payer: Self-pay | Admitting: Obstetrics & Gynecology

## 2012-11-28 ENCOUNTER — Encounter (HOSPITAL_COMMUNITY): Payer: Self-pay

## 2012-11-28 ENCOUNTER — Emergency Department (HOSPITAL_COMMUNITY): Payer: Self-pay

## 2012-11-28 ENCOUNTER — Emergency Department (HOSPITAL_COMMUNITY)
Admission: EM | Admit: 2012-11-28 | Discharge: 2012-11-28 | Disposition: A | Discharge status: Home / Self Care | Payer: Self-pay | Attending: Emergency Medicine | Admitting: Emergency Medicine

## 2012-11-28 DIAGNOSIS — Z862 Personal history of diseases of the blood and blood-forming organs and certain disorders involving the immune mechanism: Secondary | ICD-10-CM | POA: Insufficient documentation

## 2012-11-28 DIAGNOSIS — R51 Headache: Secondary | ICD-10-CM | POA: Insufficient documentation

## 2012-11-28 DIAGNOSIS — F17200 Nicotine dependence, unspecified, uncomplicated: Secondary | ICD-10-CM | POA: Insufficient documentation

## 2012-11-28 DIAGNOSIS — K089 Disorder of teeth and supporting structures, unspecified: Secondary | ICD-10-CM | POA: Insufficient documentation

## 2012-11-28 DIAGNOSIS — Z8742 Personal history of other diseases of the female genital tract: Secondary | ICD-10-CM | POA: Insufficient documentation

## 2012-11-28 DIAGNOSIS — J029 Acute pharyngitis, unspecified: Secondary | ICD-10-CM | POA: Insufficient documentation

## 2012-11-28 DIAGNOSIS — F411 Generalized anxiety disorder: Secondary | ICD-10-CM | POA: Insufficient documentation

## 2012-11-28 DIAGNOSIS — R220 Localized swelling, mass and lump, head: Secondary | ICD-10-CM | POA: Insufficient documentation

## 2012-11-28 DIAGNOSIS — L03211 Cellulitis of face: Secondary | ICD-10-CM | POA: Insufficient documentation

## 2012-11-28 DIAGNOSIS — R112 Nausea with vomiting, unspecified: Secondary | ICD-10-CM | POA: Insufficient documentation

## 2012-11-28 DIAGNOSIS — R0602 Shortness of breath: Secondary | ICD-10-CM | POA: Insufficient documentation

## 2012-11-28 DIAGNOSIS — L0201 Cutaneous abscess of face: Secondary | ICD-10-CM | POA: Insufficient documentation

## 2012-11-28 DIAGNOSIS — R509 Fever, unspecified: Secondary | ICD-10-CM | POA: Insufficient documentation

## 2012-11-28 DIAGNOSIS — Z8659 Personal history of other mental and behavioral disorders: Secondary | ICD-10-CM | POA: Insufficient documentation

## 2012-11-28 DIAGNOSIS — E876 Hypokalemia: Secondary | ICD-10-CM | POA: Insufficient documentation

## 2012-11-28 LAB — URINALYSIS, ROUTINE W REFLEX MICROSCOPIC
Glucose, UA: NEGATIVE mg/dL
Hgb urine dipstick: NEGATIVE
Nitrite: NEGATIVE
Protein, ur: 30 mg/dL — AB
Specific Gravity, Urine: 1.035 — ABNORMAL HIGH (ref 1.005–1.030)
Urobilinogen, UA: 1 mg/dL (ref 0.0–1.0)
pH: 6 (ref 5.0–8.0)

## 2012-11-28 LAB — CBC WITH DIFFERENTIAL/PLATELET
Basophils Absolute: 0 10*3/uL (ref 0.0–0.1)
Basophils Relative: 0 % (ref 0–1)
Eosinophils Absolute: 0 10*3/uL (ref 0.0–0.7)
Eosinophils Relative: 0 % (ref 0–5)
HCT: 40.3 % (ref 36.0–46.0)
Hemoglobin: 13.4 g/dL (ref 12.0–15.0)
Lymphocytes Relative: 43 % (ref 12–46)
Lymphs Abs: 2.5 10*3/uL (ref 0.7–4.0)
MCH: 27.1 pg (ref 26.0–34.0)
MCHC: 33.3 g/dL (ref 30.0–36.0)
MCV: 81.4 fL (ref 78.0–100.0)
Monocytes Absolute: 0.5 10*3/uL (ref 0.1–1.0)
Monocytes Relative: 9 % (ref 3–12)
Neutro Abs: 2.7 10*3/uL (ref 1.7–7.7)
Neutrophils Relative %: 47 % (ref 43–77)
Platelets: 123 10*3/uL — ABNORMAL LOW (ref 150–400)
RBC: 4.95 MIL/uL (ref 3.87–5.11)
RDW: 14.3 % (ref 11.5–15.5)
WBC: 5.7 10*3/uL (ref 4.0–10.5)

## 2012-11-28 LAB — BASIC METABOLIC PANEL
BUN: 12 mg/dL (ref 6–23)
CO2: 28 mEq/L (ref 19–32)
Calcium: 9.1 mg/dL (ref 8.4–10.5)
Chloride: 98 mEq/L (ref 96–112)
Creatinine, Ser: 0.91 mg/dL (ref 0.50–1.10)
GFR calc Af Amer: >90 mL/min (ref >90)
GFR calc non Af Amer: 85 mL/min — ABNORMAL LOW (ref >90)
Glucose, Bld: 84 mg/dL (ref 70–99)
Potassium: 3 mEq/L — ABNORMAL LOW (ref 3.5–5.1)
Sodium: 136 mEq/L (ref 135–145)

## 2012-11-28 LAB — URINE MICROSCOPIC-ADD ON

## 2012-11-28 LAB — POTASSIUM: Potassium: 3.3 mEq/L — ABNORMAL LOW (ref 3.5–5.1)

## 2012-11-28 MED ORDER — SODIUM CHLORIDE 0.9 % IV BOLUS (SEPSIS)
2000.0000 mL | Freq: Once | INTRAVENOUS | Status: AC
  Administered 2012-11-28: 2000 mL via INTRAVENOUS

## 2012-11-28 MED ORDER — POTASSIUM CHLORIDE CRYS ER 20 MEQ PO TBCR
40.0000 meq | EXTENDED_RELEASE_TABLET | Freq: Once | ORAL | Status: AC
  Administered 2012-11-28: 40 meq via ORAL
  Filled 2012-11-28: qty 2

## 2012-11-28 MED ORDER — SODIUM CHLORIDE 0.9 % IV BOLUS (SEPSIS)
1000.0000 mL | Freq: Once | INTRAVENOUS | Status: AC
  Administered 2012-11-28: 1000 mL via INTRAVENOUS

## 2012-11-28 MED ORDER — POTASSIUM CHLORIDE 10 MEQ/100ML IV SOLN
10.0000 meq | Freq: Once | INTRAVENOUS | Status: AC
  Administered 2012-11-28: 10 meq via INTRAVENOUS
  Filled 2012-11-28: qty 100

## 2012-11-28 MED ORDER — IOHEXOL 300 MG/ML  SOLN
100.0000 mL | Freq: Once | INTRAMUSCULAR | Status: AC | PRN
  Administered 2012-11-28: 100 mL via INTRAVENOUS

## 2012-11-28 MED ORDER — HYDROMORPHONE HCL PF 1 MG/ML IJ SOLN
1.0000 mg | Freq: Once | INTRAMUSCULAR | Status: AC
  Administered 2012-11-28: 1 mg via INTRAVENOUS
  Filled 2012-11-28: qty 1

## 2012-11-28 MED ORDER — CLINDAMYCIN PHOSPHATE 600 MG/50ML IV SOLN
600.0000 mg | Freq: Once | INTRAVENOUS | Status: AC
  Administered 2012-11-28: 600 mg via INTRAVENOUS
  Filled 2012-11-28: qty 50

## 2012-11-28 MED ORDER — ONDANSETRON HCL 4 MG/2ML IJ SOLN
4.0000 mg | Freq: Once | INTRAMUSCULAR | Status: AC
  Administered 2012-11-28: 4 mg via INTRAVENOUS
  Filled 2012-11-28: qty 2

## 2012-11-28 MED ORDER — DEXAMETHASONE SODIUM PHOSPHATE 10 MG/ML IJ SOLN
4.0000 mg | Freq: Once | INTRAMUSCULAR | Status: AC
  Administered 2012-11-28: 4 mg via INTRAVENOUS
  Filled 2012-11-28: qty 1

## 2012-11-28 MED ORDER — HYDROMORPHONE HCL PF 1 MG/ML IJ SOLN
0.5000 mg | Freq: Once | INTRAMUSCULAR | Status: AC
  Administered 2012-11-28: 0.5 mg via INTRAVENOUS
  Filled 2012-11-28: qty 1

## 2012-11-28 MED ORDER — CLINDAMYCIN HCL 300 MG PO CAPS: 300.0000 mg | ORAL_CAPSULE | Freq: Four times a day (QID) | ORAL | Status: DC

## 2012-11-28 NOTE — ED Notes (Signed)
Patient reports that she had oral surgery yesterday, but had a fever( 103.0) and cough 2 days ago. Patient called the oral surgeon and was told to come to the ED for further evaluation and treatment. Patient has swelling to both jaws. Patient is also having a non productive cough.

## 2012-11-28 NOTE — ED Provider Notes (Signed)
Medical screening examination/treatment/procedure(s) were performed by non-physician practitioner and as supervising physician I was immediately available for consultation/collaboration.  Ziyana Morikawa, MD 11/28/12 2113 

## 2012-11-28 NOTE — ED Provider Notes (Signed)
MSE was initiated and I personally evaluated the patient and placed orders (if any) at  3:25 PM on November 28, 2012.  The patient appears stable so that the remainder of the MSE may be completed by another provider.  Patient presents emergency department with fever, nausea, vomitin, the last 3 days.  Patient states she had 4 teeth extracted yesterday as well.  Patient states that she's had significant swelling along the jaw line and continued upper respiratory symptoms   Carlyle Dolly, PA-C 11/28/12 1526
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
## 2012-11-29 NOTE — ED Provider Notes (Signed)
Medical screening examination/treatment/procedure(s) were performed by non-physician practitioner and as supervising physician I was immediately available for consultation/collaboration.   David H Yao, MD 11/29/12 1456 

## 2013-01-17 ENCOUNTER — Other Ambulatory Visit: Payer: Self-pay | Admitting: Obstetrics & Gynecology

## 2013-01-21 ENCOUNTER — Ambulatory Visit: Payer: Self-pay | Admitting: Advanced Practice Midwife

## 2013-01-21 ENCOUNTER — Telehealth: Payer: Self-pay | Admitting: Obstetrics and Gynecology

## 2013-01-21 MED ORDER — ESTRADIOL 1 MG PO TABS: 1.0000 mg | ORAL_TABLET | Freq: Two times a day (BID) | ORAL | Status: DC

## 2013-01-21 NOTE — Telephone Encounter (Signed)
Patient called requesting for Estradiol RF due to her increasing hot flashes. Per Dr. Jolayne Panther, approved for RF. Patient notified of Rf sent to pharmacy and satisfied.

## 2013-02-07 ENCOUNTER — Emergency Department (HOSPITAL_COMMUNITY)
Admission: EM | Admit: 2013-02-07 | Discharge: 2013-02-07 | Disposition: A | Discharge status: Home / Self Care | Payer: Self-pay | Attending: Emergency Medicine | Admitting: Emergency Medicine

## 2013-02-07 ENCOUNTER — Emergency Department (HOSPITAL_COMMUNITY): Payer: Self-pay

## 2013-02-07 ENCOUNTER — Encounter (HOSPITAL_COMMUNITY): Payer: Self-pay | Admitting: Emergency Medicine

## 2013-02-07 DIAGNOSIS — Z8659 Personal history of other mental and behavioral disorders: Secondary | ICD-10-CM | POA: Insufficient documentation

## 2013-02-07 DIAGNOSIS — R300 Dysuria: Secondary | ICD-10-CM | POA: Insufficient documentation

## 2013-02-07 DIAGNOSIS — F17200 Nicotine dependence, unspecified, uncomplicated: Secondary | ICD-10-CM | POA: Insufficient documentation

## 2013-02-07 DIAGNOSIS — R197 Diarrhea, unspecified: Secondary | ICD-10-CM | POA: Insufficient documentation

## 2013-02-07 DIAGNOSIS — N390 Urinary tract infection, site not specified: Secondary | ICD-10-CM | POA: Insufficient documentation

## 2013-02-07 DIAGNOSIS — R319 Hematuria, unspecified: Secondary | ICD-10-CM | POA: Insufficient documentation

## 2013-02-07 DIAGNOSIS — Z8742 Personal history of other diseases of the female genital tract: Secondary | ICD-10-CM | POA: Insufficient documentation

## 2013-02-07 DIAGNOSIS — Z862 Personal history of diseases of the blood and blood-forming organs and certain disorders involving the immune mechanism: Secondary | ICD-10-CM | POA: Insufficient documentation

## 2013-02-07 DIAGNOSIS — R1110 Vomiting, unspecified: Secondary | ICD-10-CM

## 2013-02-07 DIAGNOSIS — R509 Fever, unspecified: Secondary | ICD-10-CM | POA: Insufficient documentation

## 2013-02-07 DIAGNOSIS — Z79899 Other long term (current) drug therapy: Secondary | ICD-10-CM | POA: Insufficient documentation

## 2013-02-07 DIAGNOSIS — R630 Anorexia: Secondary | ICD-10-CM | POA: Insufficient documentation

## 2013-02-07 DIAGNOSIS — R109 Unspecified abdominal pain: Secondary | ICD-10-CM | POA: Insufficient documentation

## 2013-02-07 DIAGNOSIS — J3489 Other specified disorders of nose and nasal sinuses: Secondary | ICD-10-CM | POA: Insufficient documentation

## 2013-02-07 DIAGNOSIS — J329 Chronic sinusitis, unspecified: Secondary | ICD-10-CM | POA: Insufficient documentation

## 2013-02-07 LAB — CBC WITH DIFFERENTIAL/PLATELET
Basophils Absolute: 0 10*3/uL (ref 0.0–0.1)
Basophils Relative: 1 % (ref 0–1)
Eosinophils Absolute: 0.4 10*3/uL (ref 0.0–0.7)
Eosinophils Relative: 8 % — ABNORMAL HIGH (ref 0–5)
HCT: 41.7 % (ref 36.0–46.0)
Hemoglobin: 13.4 g/dL (ref 12.0–15.0)
MCH: 26.5 pg (ref 26.0–34.0)
MCHC: 32.1 g/dL (ref 30.0–36.0)
MCV: 82.4 fL (ref 78.0–100.0)
Monocytes Absolute: 0.5 10*3/uL (ref 0.1–1.0)
Monocytes Relative: 10 % (ref 3–12)
Neutro Abs: 2.1 10*3/uL (ref 1.7–7.7)
RDW: 14.6 % (ref 11.5–15.5)

## 2013-02-07 LAB — COMPREHENSIVE METABOLIC PANEL
Albumin: 4 g/dL (ref 3.5–5.2)
BUN: 11 mg/dL (ref 6–23)
Calcium: 9.6 mg/dL (ref 8.4–10.5)
Chloride: 100 mEq/L (ref 96–112)
Creatinine, Ser: 0.75 mg/dL (ref 0.50–1.10)
Total Bilirubin: 0.3 mg/dL (ref 0.3–1.2)

## 2013-02-07 LAB — URINALYSIS, ROUTINE W REFLEX MICROSCOPIC
Nitrite: NEGATIVE
Specific Gravity, Urine: 1.017 (ref 1.005–1.030)
pH: 6.5 (ref 5.0–8.0)

## 2013-02-07 LAB — LIPASE, BLOOD: Lipase: 23 U/L (ref 11–59)

## 2013-02-07 LAB — URINE MICROSCOPIC-ADD ON

## 2013-02-07 MED ORDER — ONDANSETRON HCL 4 MG/2ML IJ SOLN
4.0000 mg | Freq: Once | INTRAMUSCULAR | Status: AC
  Administered 2013-02-07: 4 mg via INTRAVENOUS
  Filled 2013-02-07: qty 2

## 2013-02-07 MED ORDER — SULFAMETHOXAZOLE-TRIMETHOPRIM 800-160 MG PO TABS: 1.0000 | ORAL_TABLET | Freq: Two times a day (BID) | ORAL | Status: DC

## 2013-02-07 MED ORDER — HYDROMORPHONE HCL PF 1 MG/ML IJ SOLN
1.0000 mg | Freq: Once | INTRAMUSCULAR | Status: AC
  Administered 2013-02-07: 1 mg via INTRAVENOUS
  Filled 2013-02-07: qty 1

## 2013-02-07 MED ORDER — SODIUM CHLORIDE 0.9 % IV BOLUS (SEPSIS)
1000.0000 mL | Freq: Once | INTRAVENOUS | Status: AC
  Administered 2013-02-07: 1000 mL via INTRAVENOUS

## 2013-02-07 MED ORDER — TRAMADOL HCL 50 MG PO TABS: 50.0000 mg | ORAL_TABLET | Freq: Four times a day (QID) | ORAL | Status: DC | PRN

## 2013-02-07 MED ORDER — PROMETHAZINE HCL 25 MG PO TABS: 25.0000 mg | ORAL_TABLET | Freq: Four times a day (QID) | ORAL | Status: DC | PRN

## 2013-02-07 MED ORDER — PROMETHAZINE HCL 25 MG/ML IJ SOLN
25.0000 mg | Freq: Once | INTRAMUSCULAR | Status: AC
  Administered 2013-02-07: 25 mg via INTRAVENOUS
  Filled 2013-02-07: qty 1

## 2013-02-07 MED ORDER — MORPHINE SULFATE 4 MG/ML IJ SOLN
4.0000 mg | Freq: Once | INTRAMUSCULAR | Status: DC
  Filled 2013-02-07: qty 1

## 2013-02-07 MED ORDER — PHENAZOPYRIDINE HCL 200 MG PO TABS: 200.0000 mg | ORAL_TABLET | Freq: Three times a day (TID) | ORAL | Status: DC | PRN
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
## 2013-02-07 NOTE — ED Notes (Signed)
Pt states that she does not have any difficulties urinating. Denies dysuria, frequency, or hesitation.

## 2013-02-07 NOTE — ED Provider Notes (Signed)
Patient signed out to me to followup on the urinalysis. She had been seen for nausea and vomiting with dark urine. Urinalysis was equivocal, but patient reports that she has been treated for a urinary tract infection several times in the last 2 years. She has been seeing some blood and has had a hysterectomy, bleeding likely the urinary tract. Patient therefore be treated.  She was reexamined and is sleeping. She was awakened and appears to be resting comfortably. She has taken a small amount of water by mouth. Patient will be discharged with symptomatic treatment.  Gilda Crease, MD 02/07/13 [phone]

## 2013-02-07 NOTE — ED Notes (Signed)
Patient transported to X-ray 

## 2013-02-07 NOTE — ED Notes (Signed)
Pt states that she has had sinus infection for a few days then had abd pain with dark urine with spoting. Has had a hysterectomy so unsure if she has an uti. Just not feeling well.

## 2013-02-21 ENCOUNTER — Ambulatory Visit (INDEPENDENT_AMBULATORY_CARE_PROVIDER_SITE_OTHER): Payer: Self-pay | Admitting: Obstetrics & Gynecology

## 2013-02-21 ENCOUNTER — Encounter: Payer: Self-pay | Admitting: Obstetrics & Gynecology

## 2013-02-21 ENCOUNTER — Other Ambulatory Visit: Payer: Self-pay | Admitting: Obstetrics and Gynecology

## 2013-02-21 VITALS — BP 124/82 | HR 90 | Ht 61.0 in | Wt 116.1 lb

## 2013-02-21 DIAGNOSIS — R102 Pelvic and perineal pain: Secondary | ICD-10-CM

## 2013-02-21 DIAGNOSIS — N949 Unspecified condition associated with female genital organs and menstrual cycle: Secondary | ICD-10-CM

## 2013-02-21 DIAGNOSIS — B9689 Other specified bacterial agents as the cause of diseases classified elsewhere: Secondary | ICD-10-CM

## 2013-02-21 DIAGNOSIS — N760 Acute vaginitis: Secondary | ICD-10-CM

## 2013-02-21 DIAGNOSIS — A499 Bacterial infection, unspecified: Secondary | ICD-10-CM

## 2013-02-21 DIAGNOSIS — N951 Menopausal and female climacteric states: Secondary | ICD-10-CM

## 2013-02-21 MED ORDER — OXYCODONE-ACETAMINOPHEN 5-325 MG PO TABS: 1.0000 | ORAL_TABLET | Freq: Four times a day (QID) | ORAL | Status: DC | PRN

## 2013-02-21 MED ORDER — FLUCONAZOLE 150 MG PO TABS: 150.0000 mg | ORAL_TABLET | Freq: Once | ORAL | Status: DC

## 2013-02-21 MED ORDER — EST ESTROGENS-METHYLTEST 0.625-1.25 MG PO TABS: 1.0000 | ORAL_TABLET | Freq: Every day | ORAL | Status: DC

## 2013-02-21 NOTE — Patient Instructions (Signed)
Pelvic Pain Pelvic pain is pain below the belly button and located between your hips. Acute pain may last a few hours or days. Chronic pelvic pain may last weeks and months. The cause may be different for different types of pain. The pain may be dull or sharp, mild or severe and can interfere with your daily activities. Write down and tell your caregiver:   Exactly where the pain is located.  If it comes and goes or is there all the time.  When it happens (with sex, urination, bowel movement, etc.)  If the pain is related to your menstrual period or stress. Your caregiver will take a full history and do a complete physical exam and Pap test. CAUSES   Painful menstrual periods (dysmenorrhea).  Normal ovulation (Mittelschmertz) that occurs in the middle of the menstrual cycle every month.  The pelvic organs get engorged with blood just before the menstrual period (pelvic congestive syndrome).  Scar tissue from an infection or past surgery (pelvic adhesions).  Cancer of the female pelvic organs. When there is pain with cancer, it has been there for a long time.  The lining of the uterus (endometrium) abnormally grows in places like the pelvis and on the pelvic organs (endometriosis).  A form of endometriosis with the lining of the uterus present inside of the muscle tissue of the uterus (adenomyosis).  Fibroid tumor (noncancerous) in the uterus.  Bladder problems such as infection, bladder spasms of the muscle tissue of the bladder.  Intestinal problems (irritable bowel syndrome, colitis, an ulcer or gastrointestinal infection).  Polyps of the cervix or uterus.  Pregnancy in the tube (ectopic pregnancy).  The opening of the cervix is too small for the menstrual blood to flow through it (cervical stenosis).  Physical or sexual abuse (past or present).  Musculo-skeletal problems from poor posture, problems with the vertebrae of the lower back or the uterine pelvic muscles falling  (prolapse).  Psychological problems such as depression or stress.  IUD (intrauterine device) in the uterus. DIAGNOSIS  Tests to make a diagnosis depends on the type, location, severity and what causes the pain to occur. Tests that may be needed include:  Blood tests.  Urine tests  Ultrasound.  X-rays.  CT Scan.  MRI.  Laparoscopy.  Major surgery. TREATMENT  Treatment will depend on the cause of the pain, which includes:  Prescription or over-the-counter pain medication.  Antibiotics.  Birth control pills.  Hormone treatment.  Nerve blocking injections.  Physical therapy.  Antidepressants.  Counseling with a psychiatrist or psychologist.  Minor or major surgery. HOME CARE INSTRUCTIONS   Only take over-the-counter or prescription medicines for pain, discomfort or fever as directed by your caregiver.  Follow your caregiver's advice to treat your pain.  Rest.  Avoid sexual intercourse if it causes the pain.  Apply warm or cold compresses (which ever works best) to the pain area.  Do relaxation exercises such as yoga or meditation.  Try acupuncture.  Avoid stressful situations.  Try group therapy.  If the pain is because of a stomach/intestinal upset, drink clear liquids, eat a bland light food diet until the symptoms go away. SEEK MEDICAL CARE IF:   You need stronger prescription pain medication.  You develop pain with sexual intercourse.  You have pain with urination.  You develop a temperature of 102 F (38.9 C) with the pain.  You are still in pain after 4 hours of taking prescription medication for the pain.  You need depression medication.    Your IUD is causing pain and you want it removed. SEEK IMMEDIATE MEDICAL CARE IF:  You develop very severe pain or tenderness.  You faint, have chills, severe weakness or dehydration.  You develop heavy vaginal bleeding or passing solid tissue.  You develop a temperature of 102 F (38.9 C)  with the pain.  You have blood in the urine.  You are being physically or sexually abused.  You have uncontrolled vomiting and diarrhea.  You are depressed and afraid of harming yourself or someone else. Document Released: 12/22/2004 Document Revised: 02/06/2012 Document Reviewed: 09/18/2008 [employer] Patient Information 2013 [employer].  
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
## 2013-02-22 ENCOUNTER — Telehealth: Payer: Self-pay | Admitting: *Deleted

## 2013-02-22 LAB — WET PREP, GENITAL

## 2013-02-22 MED ORDER — METRONIDAZOLE 500 MG PO TABS: 500.0000 mg | ORAL_TABLET | Freq: Two times a day (BID) | ORAL | Status: AC

## 2013-02-22 NOTE — Addendum Note (Signed)
Addended by: Adam Phenix on: 02/22/2013 11:37 AM   Modules accepted: Orders

## 2013-02-22 NOTE — Telephone Encounter (Signed)
Discussed with Dr. Debroah Loop and he put order in for flagyl. Called Shanda and informed her order was sent to her pharmacy

## 2013-02-22 NOTE — Telephone Encounter (Signed)
Katelyn [name] called and left a message stating she was here yesterday and was seen by Dr. Debroah Loop and he referred her to Urology, diagnosed her with bacterial infection and was going to prescibe an antibiotic but she didn't get that rx at pharmacy. States she got the diflucan, pain med and estrogen.

## 2013-03-25 ENCOUNTER — Encounter (HOSPITAL_COMMUNITY): Payer: Self-pay | Admitting: *Deleted

## 2013-03-25 ENCOUNTER — Inpatient Hospital Stay (HOSPITAL_COMMUNITY)
Admission: AD | Admit: 2013-03-25 | Discharge: 2013-03-25 | Disposition: A | Discharge status: Home / Self Care | Payer: Self-pay | Source: Ambulatory Visit | Attending: Obstetrics and Gynecology | Admitting: Obstetrics and Gynecology

## 2013-03-25 ENCOUNTER — Inpatient Hospital Stay (HOSPITAL_COMMUNITY): Payer: Self-pay

## 2013-03-25 DIAGNOSIS — N750 Cyst of Bartholin's gland: Secondary | ICD-10-CM | POA: Insufficient documentation

## 2013-03-25 DIAGNOSIS — M549 Dorsalgia, unspecified: Secondary | ICD-10-CM | POA: Insufficient documentation

## 2013-03-25 DIAGNOSIS — R109 Unspecified abdominal pain: Secondary | ICD-10-CM | POA: Insufficient documentation

## 2013-03-25 DIAGNOSIS — Z90710 Acquired absence of both cervix and uterus: Secondary | ICD-10-CM | POA: Insufficient documentation

## 2013-03-25 LAB — CBC
Hemoglobin: 13.8 g/dL (ref 12.0–15.0)
RBC: 5.07 MIL/uL (ref 3.87–5.11)

## 2013-03-25 LAB — URINALYSIS, ROUTINE W REFLEX MICROSCOPIC
Glucose, UA: NEGATIVE mg/dL
Leukocytes, UA: NEGATIVE
Nitrite: NEGATIVE
Specific Gravity, Urine: <1.005 — ABNORMAL LOW (ref 1.005–1.030)
pH: 5.5 (ref 5.0–8.0)

## 2013-03-25 MED ORDER — TRAMADOL HCL 50 MG PO TABS: 50.0000 mg | ORAL_TABLET | Freq: Four times a day (QID) | ORAL | Status: DC | PRN

## 2013-03-25 MED ORDER — KETOROLAC TROMETHAMINE 30 MG/ML IJ SOLN
30.0000 mg | Freq: Once | INTRAMUSCULAR | Status: AC
  Administered 2013-03-25: 30 mg via INTRAMUSCULAR
  Filled 2013-03-25: qty 1

## 2013-03-25 NOTE — [facility] Provider Note (Signed)
Attestation of Attending Supervision of Advanced Practitioner (CNM/NP): Evaluation and management procedures were performed by the Advanced Practitioner under my supervision and collaboration.  I have reviewed the Advanced Practitioner's note and chart, and I agree with the management and plan.  Ginevra Tacker 03/25/2013 3:30 PM
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
## 2013-03-25 NOTE — MAU Note (Signed)
Had hysterectomy last May.  Is spotting. Having bad cramping.  Was referred to urologist, can't afford that at this time.  Frequency but no burning with urination, has cut caffeine.

## 2013-04-19 ENCOUNTER — Encounter (HOSPITAL_BASED_OUTPATIENT_CLINIC_OR_DEPARTMENT_OTHER): Payer: Self-pay | Admitting: Emergency Medicine

## 2013-04-19 ENCOUNTER — Emergency Department (HOSPITAL_BASED_OUTPATIENT_CLINIC_OR_DEPARTMENT_OTHER)
Admission: EM | Admit: 2013-04-19 | Discharge: 2013-04-19 | Disposition: A | Discharge status: Home / Self Care | Payer: Self-pay | Attending: Emergency Medicine | Admitting: Emergency Medicine

## 2013-04-19 DIAGNOSIS — J029 Acute pharyngitis, unspecified: Secondary | ICD-10-CM

## 2013-04-19 DIAGNOSIS — F17200 Nicotine dependence, unspecified, uncomplicated: Secondary | ICD-10-CM | POA: Insufficient documentation

## 2013-04-19 DIAGNOSIS — Z8742 Personal history of other diseases of the female genital tract: Secondary | ICD-10-CM | POA: Insufficient documentation

## 2013-04-19 DIAGNOSIS — R1310 Dysphagia, unspecified: Secondary | ICD-10-CM | POA: Insufficient documentation

## 2013-04-19 DIAGNOSIS — Z8659 Personal history of other mental and behavioral disorders: Secondary | ICD-10-CM | POA: Insufficient documentation

## 2013-04-19 DIAGNOSIS — Z862 Personal history of diseases of the blood and blood-forming organs and certain disorders involving the immune mechanism: Secondary | ICD-10-CM | POA: Insufficient documentation

## 2013-04-19 DIAGNOSIS — Z79899 Other long term (current) drug therapy: Secondary | ICD-10-CM | POA: Insufficient documentation

## 2013-04-19 DIAGNOSIS — IMO0001 Reserved for inherently not codable concepts without codable children: Secondary | ICD-10-CM | POA: Insufficient documentation

## 2013-04-19 DIAGNOSIS — R509 Fever, unspecified: Secondary | ICD-10-CM | POA: Insufficient documentation

## 2013-04-19 DIAGNOSIS — IMO0002 Reserved for concepts with insufficient information to code with codable children: Secondary | ICD-10-CM | POA: Insufficient documentation

## 2013-04-19 DIAGNOSIS — R112 Nausea with vomiting, unspecified: Secondary | ICD-10-CM

## 2013-04-19 DIAGNOSIS — Z880 Allergy status to penicillin: Secondary | ICD-10-CM | POA: Insufficient documentation

## 2013-04-19 DIAGNOSIS — Z8619 Personal history of other infectious and parasitic diseases: Secondary | ICD-10-CM | POA: Insufficient documentation

## 2013-04-19 LAB — RAPID STREP SCREEN (MED CTR MEBANE ONLY): Streptococcus, Group A Screen (Direct): NEGATIVE

## 2013-04-19 MED ORDER — SODIUM CHLORIDE 0.9 % IV BOLUS (SEPSIS)
1000.0000 mL | Freq: Once | INTRAVENOUS | Status: AC
  Administered 2013-04-19: 1000 mL via INTRAVENOUS

## 2013-04-19 MED ORDER — DEXTROSE 5 % IV SOLN
1.0000 g | Freq: Once | INTRAVENOUS | Status: AC
  Administered 2013-04-19: 1 g via INTRAVENOUS
  Filled 2013-04-19: qty 10

## 2013-04-19 MED ORDER — ONDANSETRON HCL 4 MG/2ML IJ SOLN
4.0000 mg | Freq: Once | INTRAMUSCULAR | Status: AC
  Administered 2013-04-19: 4 mg via INTRAVENOUS
  Filled 2013-04-19: qty 2

## 2013-04-19 MED ORDER — PROMETHAZINE HCL 25 MG PO TABS: 25.0000 mg | ORAL_TABLET | Freq: Four times a day (QID) | ORAL | Status: DC | PRN

## 2013-04-19 MED ORDER — KETOROLAC TROMETHAMINE 30 MG/ML IJ SOLN
30.0000 mg | Freq: Once | INTRAMUSCULAR | Status: AC
  Administered 2013-04-19: 30 mg via INTRAVENOUS
  Filled 2013-04-19: qty 1

## 2013-04-19 MED ORDER — MORPHINE SULFATE 4 MG/ML IJ SOLN
4.0000 mg | Freq: Once | INTRAMUSCULAR | Status: AC
  Administered 2013-04-19: 4 mg via INTRAVENOUS
  Filled 2013-04-19: qty 1

## 2013-04-19 MED ORDER — OXYCODONE-ACETAMINOPHEN 5-325 MG PO TABS: ORAL_TABLET | ORAL | Status: DC

## 2013-04-19 NOTE — ED Notes (Signed)
Pt with sore throat and "white stuff in the back of my throat" for one week, now with vomitting x 7-8 in past 24 hours. Fever of 103.8 at home per pt.
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
## 2013-04-19 NOTE — Discharge Instructions (Signed)
 Nausea and Vomiting Nausea is a sick feeling that often comes before throwing up (vomiting). Vomiting is a reflex where stomach contents come out of your mouth. Vomiting can cause severe loss of body fluids (dehydration). Children and elderly adults can become dehydrated quickly, especially if they also have diarrhea. Nausea and vomiting are symptoms of a condition or disease. It is important to find the cause of your symptoms. CAUSES   Direct irritation of the stomach lining. This irritation can result from increased acid production (gastroesophageal reflux disease), infection, food poisoning, taking certain medicines (such as nonsteroidal anti-inflammatory drugs), alcohol use, or tobacco use.  Signals from the brain.These signals could be caused by a headache, heat exposure, an inner ear disturbance, increased pressure in the brain from injury, infection, a tumor, or a concussion, pain, emotional stimulus, or metabolic problems.  An obstruction in the gastrointestinal tract (bowel obstruction).  Illnesses such as diabetes, hepatitis, gallbladder problems, appendicitis, kidney problems, cancer, sepsis, atypical symptoms of a heart attack, or eating disorders.  Medical treatments such as chemotherapy and radiation.  Receiving medicine that makes you sleep (general anesthetic) during surgery. DIAGNOSIS Your caregiver may ask for tests to be done if the problems do not improve after a few days. Tests may also be done if symptoms are severe or if the reason for the nausea and vomiting is not clear. Tests may include:  Urine tests.  Blood tests.  Stool tests.  Cultures (to look for evidence of infection).  X-rays or other imaging studies. Test results can help your caregiver make decisions about treatment or the need for additional tests. TREATMENT You need to stay well hydrated. Drink frequently but in small amounts.You may wish to drink water , sports drinks, clear broth, or eat frozen  ice pops or gelatin dessert to help stay hydrated.When you eat, eating slowly may help prevent nausea.There are also some antinausea medicines that may help prevent nausea. HOME CARE INSTRUCTIONS   Take all medicine as directed by your caregiver.  If you do not have an appetite, do not force yourself to eat. However, you must continue to drink fluids.  If you have an appetite, eat a normal diet unless your caregiver tells you differently.  Eat a variety of complex carbohydrates (rice, wheat, potatoes, bread), lean meats, yogurt, fruits, and vegetables.  Avoid high-fat foods because they are more difficult to digest.  Drink enough water  and fluids to keep your urine clear or pale yellow.  If you are dehydrated, ask your caregiver for specific rehydration instructions. Signs of dehydration may include:  Severe thirst.  Dry lips and mouth.  Dizziness.  Dark urine.  Decreasing urine frequency and amount.  Confusion.  Rapid breathing or pulse. SEEK IMMEDIATE MEDICAL CARE IF:   You have blood or brown flecks (like coffee grounds) in your vomit.  You have black or bloody stools.  You have a severe headache or stiff neck.  You are confused.  You have severe abdominal pain.  You have chest pain or trouble breathing.  You do not urinate at least once every 8 hours.  You develop cold or clammy skin.  You continue to vomit for longer than 24 to 48 hours.  You have a fever. MAKE SURE YOU:   Understand these instructions.  Will watch your condition.  Will get help right away if you are not doing well or get worse. Document Released: 11/14/2005 Document Revised: 02/06/2012 Document Reviewed: 04/13/2011 [facility] Patient Information 2014 [place], [place].    Narcotic and  benzodiazepine use may cause drowsiness, slowed breathing or dependence.  Please use with caution and do not drive, operate machinery or watch young children alone while taking them.  Taking  combinations of these medications or drinking alcohol will potentiate these effects.

## 2013-04-21 LAB — CULTURE, GROUP A STREP

## 2013-05-14 ENCOUNTER — Telehealth: Payer: Self-pay

## 2013-05-14 ENCOUNTER — Encounter (HOSPITAL_COMMUNITY): Payer: Self-pay | Admitting: *Deleted

## 2013-05-14 ENCOUNTER — Inpatient Hospital Stay (HOSPITAL_COMMUNITY)
Admission: AD | Admit: 2013-05-14 | Discharge: 2013-05-14 | Disposition: A | Discharge status: Home / Self Care | Payer: Self-pay | Source: Ambulatory Visit | Attending: Family Medicine | Admitting: Family Medicine

## 2013-05-14 ENCOUNTER — Telehealth: Payer: Self-pay | Admitting: *Deleted

## 2013-05-14 DIAGNOSIS — R109 Unspecified abdominal pain: Secondary | ICD-10-CM | POA: Insufficient documentation

## 2013-05-14 DIAGNOSIS — N390 Urinary tract infection, site not specified: Secondary | ICD-10-CM | POA: Insufficient documentation

## 2013-05-14 LAB — URINE MICROSCOPIC-ADD ON

## 2013-05-14 LAB — CBC
HCT: 40.4 % (ref 36.0–46.0)
Hemoglobin: 13.6 g/dL (ref 12.0–15.0)
WBC: 9.3 10*3/uL (ref 4.0–10.5)

## 2013-05-14 LAB — WET PREP, GENITAL: Clue Cells Wet Prep HPF POC: NONE SEEN

## 2013-05-14 LAB — URINALYSIS, ROUTINE W REFLEX MICROSCOPIC
Glucose, UA: NEGATIVE mg/dL
Hgb urine dipstick: NEGATIVE
Ketones, ur: NEGATIVE mg/dL
Protein, ur: NEGATIVE mg/dL

## 2013-05-14 MED ORDER — ACETAMINOPHEN-CODEINE 300-30 MG PO TABS: 1.0000 | ORAL_TABLET | ORAL | Status: DC | PRN

## 2013-05-14 MED ORDER — KETOROLAC TROMETHAMINE 60 MG/2ML IM SOLN
60.0000 mg | Freq: Once | INTRAMUSCULAR | Status: AC
Start: 2013-05-14 — End: 2013-05-14
  Administered 2013-05-14: 60 mg via INTRAMUSCULAR
  Filled 2013-05-14: qty 2

## 2013-05-14 MED ORDER — CIPROFLOXACIN HCL 500 MG PO TABS: 500.0000 mg | ORAL_TABLET | Freq: Two times a day (BID) | ORAL | Status: DC

## 2013-05-14 NOTE — MAU Note (Signed)
Patient c/o of abdominal pain, nausea, vomiting, and diarrhea since Friday. States had temperature of 102.4 today around noon.

## 2013-05-14 NOTE — Telephone Encounter (Signed)
Opened in error
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
## 2013-05-14 NOTE — MAU Note (Signed)
Patient states she started having left lower abdominal pain on 6-13 and vomited once that day. On 6-14 started having diarrhea, more vomiting and pain. Pain has continued with vomiting and diarrhea. Denies bleeding or vaginal discharge. Patient has had a hysterectomy.

## 2013-05-14 NOTE — Telephone Encounter (Signed)
Pt called and stated that she has been having lower abdominal pain x4 days. She also has been having nausea and vomiting of bile during this time. She states that she is unable to keep anything down- including fluids. She began having diarrhea today. She is having difficulty standing straight because the pain increases with that activity. I advised pt to go to Urgent Care for evaluation as she has had a hysterectomy and these sx are unlikely to be Gyn related. Pt voiced understanding.

## 2013-05-14 NOTE — MAU Provider Note (Signed)
Chart reviewed and agree with management and plan.  

## 2013-05-16 ENCOUNTER — Telehealth: Payer: Self-pay | Admitting: Gynecology

## 2013-05-16 LAB — URINE CULTURE: Colony Count: >=100000

## 2013-05-16 NOTE — Telephone Encounter (Signed)
Pt called because pt treated for UTI with Cipro and culture shows resistant to Cipro Called [phone] and female answered phone and stated pt not living there Called [phone](mother's) and LM on VM for pt to  [facility]

## 2013-05-18 ENCOUNTER — Other Ambulatory Visit: Payer: Self-pay | Admitting: Medical

## 2013-05-18 DIAGNOSIS — B952 Enterococcus as the cause of diseases classified elsewhere: Secondary | ICD-10-CM

## 2013-05-18 MED ORDER — NITROFURANTOIN MONOHYD MACRO 100 MG PO CAPS: 100.0000 mg | ORAL_CAPSULE | Freq: Two times a day (BID) | ORAL | Status: DC

## 2013-05-18 NOTE — Progress Notes (Signed)
In basket message sent to Loyce Dys to send certified letter to patient that she needs new Rx for UTI. Resistant to Cipro. Rx for Macrobid sent to patient's pharmacy.   Freddi Starr, PA-C 05/18/2013 1:43 PM

## 2013-05-20 ENCOUNTER — Encounter (HOSPITAL_COMMUNITY): Payer: Self-pay | Admitting: *Deleted

## 2013-07-15 ENCOUNTER — Emergency Department (HOSPITAL_BASED_OUTPATIENT_CLINIC_OR_DEPARTMENT_OTHER): Payer: Self-pay

## 2013-07-15 ENCOUNTER — Encounter (HOSPITAL_BASED_OUTPATIENT_CLINIC_OR_DEPARTMENT_OTHER): Payer: Self-pay | Admitting: *Deleted

## 2013-07-15 ENCOUNTER — Other Ambulatory Visit: Payer: Self-pay

## 2013-07-15 ENCOUNTER — Emergency Department (HOSPITAL_BASED_OUTPATIENT_CLINIC_OR_DEPARTMENT_OTHER)
Admission: EM | Admit: 2013-07-15 | Discharge: 2013-07-15 | Disposition: A | Discharge status: Home / Self Care | Payer: Self-pay | Attending: Emergency Medicine | Admitting: Emergency Medicine

## 2013-07-15 DIAGNOSIS — R5383 Other fatigue: Secondary | ICD-10-CM

## 2013-07-15 DIAGNOSIS — F17200 Nicotine dependence, unspecified, uncomplicated: Secondary | ICD-10-CM | POA: Insufficient documentation

## 2013-07-15 DIAGNOSIS — Z8659 Personal history of other mental and behavioral disorders: Secondary | ICD-10-CM | POA: Insufficient documentation

## 2013-07-15 DIAGNOSIS — Z8619 Personal history of other infectious and parasitic diseases: Secondary | ICD-10-CM | POA: Insufficient documentation

## 2013-07-15 DIAGNOSIS — Z862 Personal history of diseases of the blood and blood-forming organs and certain disorders involving the immune mechanism: Secondary | ICD-10-CM | POA: Insufficient documentation

## 2013-07-15 DIAGNOSIS — R21 Rash and other nonspecific skin eruption: Secondary | ICD-10-CM | POA: Insufficient documentation

## 2013-07-15 DIAGNOSIS — L259 Unspecified contact dermatitis, unspecified cause: Secondary | ICD-10-CM | POA: Insufficient documentation

## 2013-07-15 DIAGNOSIS — R5381 Other malaise: Secondary | ICD-10-CM | POA: Insufficient documentation

## 2013-07-15 DIAGNOSIS — Z79899 Other long term (current) drug therapy: Secondary | ICD-10-CM | POA: Insufficient documentation

## 2013-07-15 DIAGNOSIS — Z8742 Personal history of other diseases of the female genital tract: Secondary | ICD-10-CM | POA: Insufficient documentation

## 2013-07-15 LAB — BASIC METABOLIC PANEL
CO2: 28 mEq/L (ref 19–32)
Calcium: 10.2 mg/dL (ref 8.4–10.5)
Glucose, Bld: 87 mg/dL (ref 70–99)
Potassium: 4 mEq/L (ref 3.5–5.1)
Sodium: 141 mEq/L (ref 135–145)

## 2013-07-15 LAB — CBC WITH DIFFERENTIAL/PLATELET
Eosinophils Relative: 5 % (ref 0–5)
Lymphocytes Relative: 37 % (ref 12–46)
Lymphs Abs: 3.3 10*3/uL (ref 0.7–4.0)
MCV: 83.3 fL (ref 78.0–100.0)
Platelets: 163 10*3/uL (ref 150–400)
RBC: 5.16 MIL/uL — ABNORMAL HIGH (ref 3.87–5.11)
WBC: 8.7 10*3/uL (ref 4.0–10.5)

## 2013-07-15 LAB — URINALYSIS, ROUTINE W REFLEX MICROSCOPIC
Glucose, UA: NEGATIVE mg/dL
Hgb urine dipstick: NEGATIVE
Specific Gravity, Urine: 1.014 (ref 1.005–1.030)
Urobilinogen, UA: 0.2 mg/dL (ref 0.0–1.0)

## 2013-07-15 MED ORDER — PREDNISONE 10 MG PO TABS: ORAL_TABLET | ORAL | Status: DC
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
## 2013-07-15 NOTE — ED Notes (Signed)
Pt c/o " passed out 3 times this weekend"  Pt c/o generalized weakness.

## 2013-07-16 ENCOUNTER — Emergency Department (HOSPITAL_BASED_OUTPATIENT_CLINIC_OR_DEPARTMENT_OTHER)
Admission: EM | Admit: 2013-07-16 | Discharge: 2013-07-16 | Disposition: A | Discharge status: Home / Self Care | Payer: Self-pay | Attending: Emergency Medicine | Admitting: Emergency Medicine

## 2013-07-16 ENCOUNTER — Encounter (HOSPITAL_BASED_OUTPATIENT_CLINIC_OR_DEPARTMENT_OTHER): Payer: Self-pay | Admitting: *Deleted

## 2013-07-16 DIAGNOSIS — IMO0002 Reserved for concepts with insufficient information to code with codable children: Secondary | ICD-10-CM | POA: Insufficient documentation

## 2013-07-16 DIAGNOSIS — R531 Weakness: Secondary | ICD-10-CM

## 2013-07-16 DIAGNOSIS — Z8619 Personal history of other infectious and parasitic diseases: Secondary | ICD-10-CM | POA: Insufficient documentation

## 2013-07-16 DIAGNOSIS — Z79899 Other long term (current) drug therapy: Secondary | ICD-10-CM | POA: Insufficient documentation

## 2013-07-16 DIAGNOSIS — R5381 Other malaise: Secondary | ICD-10-CM | POA: Insufficient documentation

## 2013-07-16 DIAGNOSIS — Z8659 Personal history of other mental and behavioral disorders: Secondary | ICD-10-CM | POA: Insufficient documentation

## 2013-07-16 DIAGNOSIS — L259 Unspecified contact dermatitis, unspecified cause: Secondary | ICD-10-CM | POA: Insufficient documentation

## 2013-07-16 DIAGNOSIS — Z90710 Acquired absence of both cervix and uterus: Secondary | ICD-10-CM | POA: Insufficient documentation

## 2013-07-16 DIAGNOSIS — R21 Rash and other nonspecific skin eruption: Secondary | ICD-10-CM | POA: Insufficient documentation

## 2013-07-16 DIAGNOSIS — Z9851 Tubal ligation status: Secondary | ICD-10-CM | POA: Insufficient documentation

## 2013-07-16 DIAGNOSIS — F411 Generalized anxiety disorder: Secondary | ICD-10-CM | POA: Insufficient documentation

## 2013-07-16 DIAGNOSIS — Z9079 Acquired absence of other genital organ(s): Secondary | ICD-10-CM | POA: Insufficient documentation

## 2013-07-16 DIAGNOSIS — Z880 Allergy status to penicillin: Secondary | ICD-10-CM | POA: Insufficient documentation

## 2013-07-16 DIAGNOSIS — Z9889 Other specified postprocedural states: Secondary | ICD-10-CM | POA: Insufficient documentation

## 2013-07-16 DIAGNOSIS — Z8742 Personal history of other diseases of the female genital tract: Secondary | ICD-10-CM | POA: Insufficient documentation

## 2013-07-16 DIAGNOSIS — F419 Anxiety disorder, unspecified: Secondary | ICD-10-CM

## 2013-07-16 DIAGNOSIS — R112 Nausea with vomiting, unspecified: Secondary | ICD-10-CM | POA: Insufficient documentation

## 2013-07-16 DIAGNOSIS — F17200 Nicotine dependence, unspecified, uncomplicated: Secondary | ICD-10-CM | POA: Insufficient documentation

## 2013-07-16 DIAGNOSIS — Z862 Personal history of diseases of the blood and blood-forming organs and certain disorders involving the immune mechanism: Secondary | ICD-10-CM | POA: Insufficient documentation

## 2013-07-16 MED ORDER — LORAZEPAM 2 MG/ML IJ SOLN
1.0000 mg | Freq: Once | INTRAMUSCULAR | Status: AC
  Administered 2013-07-16: 1 mg via INTRAVENOUS
  Filled 2013-07-16: qty 1

## 2013-07-16 MED ORDER — ALPRAZOLAM 0.5 MG PO TABS: 0.5000 mg | ORAL_TABLET | Freq: Every evening | ORAL | Status: DC | PRN

## 2013-07-16 MED ORDER — SODIUM CHLORIDE 0.9 % IV BOLUS (SEPSIS)
1000.0000 mL | Freq: Once | INTRAVENOUS | Status: AC
  Administered 2013-07-16: 1000 mL via INTRAVENOUS

## 2013-07-16 MED ORDER — ONDANSETRON HCL 4 MG/2ML IJ SOLN
4.0000 mg | Freq: Once | INTRAMUSCULAR | Status: AC
  Administered 2013-07-16: 4 mg via INTRAVENOUS
  Filled 2013-07-16: qty 2

## 2013-07-16 NOTE — ED Provider Notes (Signed)
Medical screening examination/treatment/procedure(s) were performed by non-physician practitioner and as supervising physician I was immediately available for consultation/collaboration.   Shanna Cisco, MD 07/16/13 [phone]
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
## 2013-07-16 NOTE — ED Notes (Signed)
Patient states that she "passed out 3 times this weekend". Was seen yesterday for weakness and N/V

## 2013-07-16 NOTE — ED Notes (Signed)
Pt reports improvement with ativan, feeling less anxious, no n/v since arrival

## 2013-07-18 NOTE — ED Provider Notes (Signed)
Medical screening examination/treatment/procedure(s) were performed by non-physician practitioner and as supervising physician I was immediately available for consultation/collaboration.   Eyla Tallon, MD 07/18/13 0911 

## 2013-07-31 ENCOUNTER — Inpatient Hospital Stay (HOSPITAL_COMMUNITY)
Admission: AD | Admit: 2013-07-31 | Discharge: 2013-07-31 | Disposition: A | Discharge status: Home / Self Care | Payer: Self-pay | Source: Ambulatory Visit | Attending: Family Medicine | Admitting: Family Medicine

## 2013-07-31 ENCOUNTER — Encounter (HOSPITAL_COMMUNITY): Payer: Self-pay | Admitting: *Deleted

## 2013-07-31 DIAGNOSIS — M545 Low back pain, unspecified: Secondary | ICD-10-CM | POA: Insufficient documentation

## 2013-07-31 DIAGNOSIS — R1110 Vomiting, unspecified: Secondary | ICD-10-CM | POA: Insufficient documentation

## 2013-07-31 DIAGNOSIS — R51 Headache: Secondary | ICD-10-CM | POA: Insufficient documentation

## 2013-07-31 DIAGNOSIS — R109 Unspecified abdominal pain: Secondary | ICD-10-CM

## 2013-07-31 DIAGNOSIS — Z90710 Acquired absence of both cervix and uterus: Secondary | ICD-10-CM | POA: Insufficient documentation

## 2013-07-31 HISTORY — DX: Headache: R51

## 2013-07-31 LAB — CBC
HCT: 40.8 % (ref 36.0–46.0)
Hemoglobin: 13.3 g/dL (ref 12.0–15.0)
MCH: 27.4 pg (ref 26.0–34.0)
MCV: 84 fL (ref 78.0–100.0)
Platelets: 161 10*3/uL (ref 150–400)
RBC: 4.86 MIL/uL (ref 3.87–5.11)
WBC: 7.5 10*3/uL (ref 4.0–10.5)

## 2013-07-31 LAB — COMPREHENSIVE METABOLIC PANEL
AST: 18 U/L (ref 0–37)
BUN: 13 mg/dL (ref 6–23)
CO2: 28 mEq/L (ref 19–32)
Calcium: 9.3 mg/dL (ref 8.4–10.5)
Chloride: 103 mEq/L (ref 96–112)
Creatinine, Ser: 0.7 mg/dL (ref 0.50–1.10)
GFR calc Af Amer: >90 mL/min (ref >90)
GFR calc non Af Amer: >90 mL/min (ref >90)
Glucose, Bld: 111 mg/dL — ABNORMAL HIGH (ref 70–99)
Total Bilirubin: 0.1 mg/dL — ABNORMAL LOW (ref 0.3–1.2)

## 2013-07-31 LAB — URINALYSIS, ROUTINE W REFLEX MICROSCOPIC
Glucose, UA: NEGATIVE mg/dL
Hgb urine dipstick: NEGATIVE
Specific Gravity, Urine: 1.02 (ref 1.005–1.030)
pH: 6.5 (ref 5.0–8.0)

## 2013-07-31 LAB — WET PREP, GENITAL
Clue Cells Wet Prep HPF POC: NONE SEEN
Trich, Wet Prep: NONE SEEN
Yeast Wet Prep HPF POC: NONE SEEN

## 2013-07-31 MED ORDER — KETOROLAC TROMETHAMINE 60 MG/2ML IM SOLN
60.0000 mg | INTRAMUSCULAR | Status: AC
  Administered 2013-07-31: 60 mg via INTRAMUSCULAR
  Filled 2013-07-31: qty 2

## 2013-07-31 MED ORDER — OXYCODONE-ACETAMINOPHEN 5-325 MG PO TABS: 1.0000 | ORAL_TABLET | Freq: Four times a day (QID) | ORAL | Status: DC | PRN

## 2013-07-31 MED ORDER — ONDANSETRON 8 MG PO TBDP
8.0000 mg | ORAL_TABLET | ORAL | Status: AC
  Administered 2013-07-31: 8 mg via ORAL
  Filled 2013-07-31: qty 1

## 2013-07-31 MED ORDER — PROMETHAZINE HCL 25 MG PO TABS: 12.5250 mg | ORAL_TABLET | Freq: Four times a day (QID) | ORAL | Status: DC | PRN
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
## 2013-07-31 NOTE — MAU Note (Addendum)
abd and low back pain started 3 days ago.  Has had a full hysterectomy. Vomiting also started 3 days ago.  Thought might have a UTI,  Some d/c. Keeping a headache.

## 2013-08-01 LAB — GC/CHLAMYDIA PROBE AMP: GC Probe RNA: NEGATIVE

## 2013-08-02 NOTE — MAU Provider Note (Signed)
Chart reviewed and agree with management and plan.  

## 2013-08-08 ENCOUNTER — Emergency Department (HOSPITAL_COMMUNITY): Payer: Self-pay

## 2013-08-08 ENCOUNTER — Emergency Department (HOSPITAL_COMMUNITY)
Admission: EM | Admit: 2013-08-08 | Discharge: 2013-08-08 | Disposition: A | Discharge status: Home / Self Care | Payer: Self-pay | Attending: Emergency Medicine | Admitting: Emergency Medicine

## 2013-08-08 ENCOUNTER — Encounter (HOSPITAL_COMMUNITY): Payer: Self-pay | Admitting: Emergency Medicine

## 2013-08-08 DIAGNOSIS — Z8619 Personal history of other infectious and parasitic diseases: Secondary | ICD-10-CM | POA: Insufficient documentation

## 2013-08-08 DIAGNOSIS — Z3202 Encounter for pregnancy test, result negative: Secondary | ICD-10-CM | POA: Insufficient documentation

## 2013-08-08 DIAGNOSIS — Z8742 Personal history of other diseases of the female genital tract: Secondary | ICD-10-CM | POA: Insufficient documentation

## 2013-08-08 DIAGNOSIS — R112 Nausea with vomiting, unspecified: Secondary | ICD-10-CM | POA: Insufficient documentation

## 2013-08-08 DIAGNOSIS — Z9079 Acquired absence of other genital organ(s): Secondary | ICD-10-CM | POA: Insufficient documentation

## 2013-08-08 DIAGNOSIS — Z90710 Acquired absence of both cervix and uterus: Secondary | ICD-10-CM | POA: Insufficient documentation

## 2013-08-08 DIAGNOSIS — Z87448 Personal history of other diseases of urinary system: Secondary | ICD-10-CM | POA: Insufficient documentation

## 2013-08-08 DIAGNOSIS — R1084 Generalized abdominal pain: Secondary | ICD-10-CM | POA: Insufficient documentation

## 2013-08-08 DIAGNOSIS — Z8659 Personal history of other mental and behavioral disorders: Secondary | ICD-10-CM | POA: Insufficient documentation

## 2013-08-08 DIAGNOSIS — G8929 Other chronic pain: Secondary | ICD-10-CM | POA: Insufficient documentation

## 2013-08-08 DIAGNOSIS — Z79899 Other long term (current) drug therapy: Secondary | ICD-10-CM | POA: Insufficient documentation

## 2013-08-08 DIAGNOSIS — Z792 Long term (current) use of antibiotics: Secondary | ICD-10-CM | POA: Insufficient documentation

## 2013-08-08 DIAGNOSIS — D649 Anemia, unspecified: Secondary | ICD-10-CM | POA: Insufficient documentation

## 2013-08-08 DIAGNOSIS — K5900 Constipation, unspecified: Secondary | ICD-10-CM | POA: Insufficient documentation

## 2013-08-08 DIAGNOSIS — R630 Anorexia: Secondary | ICD-10-CM | POA: Insufficient documentation

## 2013-08-08 DIAGNOSIS — F17200 Nicotine dependence, unspecified, uncomplicated: Secondary | ICD-10-CM | POA: Insufficient documentation

## 2013-08-08 LAB — COMPREHENSIVE METABOLIC PANEL
AST: 22 U/L (ref 0–37)
CO2: 24 mEq/L (ref 19–32)
Calcium: 9.8 mg/dL (ref 8.4–10.5)
Chloride: 100 mEq/L (ref 96–112)
Creatinine, Ser: 0.81 mg/dL (ref 0.50–1.10)
GFR calc Af Amer: >90 mL/min (ref >90)
GFR calc non Af Amer: >90 mL/min (ref >90)
Glucose, Bld: 84 mg/dL (ref 70–99)
Total Bilirubin: 0.6 mg/dL (ref 0.3–1.2)

## 2013-08-08 LAB — CBC WITH DIFFERENTIAL/PLATELET
Basophils Relative: 1 % (ref 0–1)
Eosinophils Absolute: 0.2 10*3/uL (ref 0.0–0.7)
Hemoglobin: 14.4 g/dL (ref 12.0–15.0)
MCH: 28.1 pg (ref 26.0–34.0)
MCHC: 33.8 g/dL (ref 30.0–36.0)
Neutro Abs: 4.6 10*3/uL (ref 1.7–7.7)
Neutrophils Relative %: 56 % (ref 43–77)
Platelets: 181 10*3/uL (ref 150–400)
RBC: 5.12 MIL/uL — ABNORMAL HIGH (ref 3.87–5.11)

## 2013-08-08 LAB — URINALYSIS, ROUTINE W REFLEX MICROSCOPIC
Leukocytes, UA: NEGATIVE
Nitrite: NEGATIVE
Protein, ur: NEGATIVE mg/dL
Specific Gravity, Urine: 1.011 (ref 1.005–1.030)
Urobilinogen, UA: 1 mg/dL (ref 0.0–1.0)

## 2013-08-08 LAB — RPR: RPR Ser Ql: NONREACTIVE

## 2013-08-08 LAB — WET PREP, GENITAL
Clue Cells Wet Prep HPF POC: NONE SEEN
WBC, Wet Prep HPF POC: NONE SEEN

## 2013-08-08 LAB — LIPASE, BLOOD: Lipase: 25 U/L (ref 11–59)

## 2013-08-08 LAB — POCT PREGNANCY, URINE: Preg Test, Ur: NEGATIVE

## 2013-08-08 MED ORDER — ONDANSETRON HCL 4 MG/2ML IJ SOLN
4.0000 mg | Freq: Once | INTRAMUSCULAR | Status: AC
  Administered 2013-08-08: 4 mg via INTRAVENOUS
  Filled 2013-08-08: qty 2

## 2013-08-08 MED ORDER — IOHEXOL 300 MG/ML  SOLN
25.0000 mL | INTRAMUSCULAR | Status: AC
  Administered 2013-08-08: 25 mL via ORAL

## 2013-08-08 MED ORDER — IOHEXOL 300 MG/ML  SOLN
80.0000 mL | Freq: Once | INTRAMUSCULAR | Status: AC | PRN
  Administered 2013-08-08: 80 mL via INTRAVENOUS

## 2013-08-08 MED ORDER — HYDROMORPHONE HCL PF 1 MG/ML IJ SOLN
1.0000 mg | Freq: Once | INTRAMUSCULAR | Status: AC
  Administered 2013-08-08: 1 mg via INTRAVENOUS
  Filled 2013-08-08: qty 1

## 2013-08-08 MED ORDER — PROMETHAZINE HCL 25 MG PO TABS: 25.0000 mg | ORAL_TABLET | Freq: Four times a day (QID) | ORAL | Status: DC | PRN

## 2013-08-08 MED ORDER — OXYCODONE-ACETAMINOPHEN 5-325 MG PO TABS: 2.0000 | ORAL_TABLET | ORAL | Status: DC | PRN

## 2013-08-08 NOTE — ED Notes (Signed)
Pt c/o generalized abd pain x 2 weeks; pt sts feeling generalized weakness due to N/V and not eating; pt sts seeing ENT and has some issues with ears

## 2013-08-08 NOTE — ED Notes (Signed)
ED PA at bedside
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
## 2013-08-08 NOTE — ED Notes (Signed)
PT finished contrast, CT notified.

## 2013-08-08 NOTE — ED Notes (Signed)
Pt comfortable with d/c and f/u instructions. Prescriptions x2. 

## 2013-08-08 NOTE — ED Notes (Signed)
EDPA made aware of pain and nausea complaints from patient.

## 2013-08-08 NOTE — ED Provider Notes (Signed)
Medical screening examination/treatment/procedure(s) were conducted as a shared visit with non-physician practitioner(s) and myself.  I personally evaluated the patient during the encounter  I interviewed and examined the patient. Lungs are CTAB. Cardiac exam wnl. Mild, diffuse, non-focal abd pain. Will get CT.   Junius Argyle, MD 08/08/13 2116

## 2013-08-22 ENCOUNTER — Emergency Department (HOSPITAL_COMMUNITY)
Admission: EM | Admit: 2013-08-22 | Discharge: 2013-08-22 | Disposition: A | Discharge status: Home / Self Care | Payer: Self-pay | Attending: Emergency Medicine | Admitting: Emergency Medicine

## 2013-08-22 ENCOUNTER — Encounter (HOSPITAL_COMMUNITY): Payer: Self-pay | Admitting: Nurse Practitioner

## 2013-08-22 DIAGNOSIS — L259 Unspecified contact dermatitis, unspecified cause: Secondary | ICD-10-CM | POA: Insufficient documentation

## 2013-08-22 DIAGNOSIS — L309 Dermatitis, unspecified: Secondary | ICD-10-CM

## 2013-08-22 DIAGNOSIS — N61 Mastitis without abscess: Secondary | ICD-10-CM | POA: Insufficient documentation

## 2013-08-22 DIAGNOSIS — Z79899 Other long term (current) drug therapy: Secondary | ICD-10-CM | POA: Insufficient documentation

## 2013-08-22 DIAGNOSIS — Z8679 Personal history of other diseases of the circulatory system: Secondary | ICD-10-CM | POA: Insufficient documentation

## 2013-08-22 DIAGNOSIS — F17200 Nicotine dependence, unspecified, uncomplicated: Secondary | ICD-10-CM | POA: Insufficient documentation

## 2013-08-22 DIAGNOSIS — Z8659 Personal history of other mental and behavioral disorders: Secondary | ICD-10-CM | POA: Insufficient documentation

## 2013-08-22 DIAGNOSIS — D649 Anemia, unspecified: Secondary | ICD-10-CM | POA: Insufficient documentation

## 2013-08-22 DIAGNOSIS — Z8719 Personal history of other diseases of the digestive system: Secondary | ICD-10-CM | POA: Insufficient documentation

## 2013-08-22 DIAGNOSIS — Z880 Allergy status to penicillin: Secondary | ICD-10-CM | POA: Insufficient documentation

## 2013-08-22 DIAGNOSIS — Z8742 Personal history of other diseases of the female genital tract: Secondary | ICD-10-CM | POA: Insufficient documentation

## 2013-08-22 DIAGNOSIS — IMO0002 Reserved for concepts with insufficient information to code with codable children: Secondary | ICD-10-CM | POA: Insufficient documentation

## 2013-08-22 HISTORY — DX: Irritable bowel syndrome, unspecified: K58.9

## 2013-08-22 LAB — BASIC METABOLIC PANEL
BUN: 16 mg/dL (ref 6–23)
CO2: 27 mEq/L (ref 19–32)
Calcium: 9.5 mg/dL (ref 8.4–10.5)
Chloride: 102 mEq/L (ref 96–112)
Creatinine, Ser: 0.76 mg/dL (ref 0.50–1.10)
GFR calc Af Amer: >90 mL/min (ref >90)

## 2013-08-22 LAB — URINALYSIS, ROUTINE W REFLEX MICROSCOPIC
Bilirubin Urine: NEGATIVE
Glucose, UA: NEGATIVE mg/dL
Ketones, ur: NEGATIVE mg/dL
Leukocytes, UA: NEGATIVE
pH: 6 (ref 5.0–8.0)

## 2013-08-22 LAB — CBC
HCT: 40.1 % (ref 36.0–46.0)
MCHC: 33.9 g/dL (ref 30.0–36.0)
MCV: 84.1 fL (ref 78.0–100.0)
RBC: 4.77 MIL/uL (ref 3.87–5.11)
WBC: 9.7 10*3/uL (ref 4.0–10.5)

## 2013-08-22 MED ORDER — SULFAMETHOXAZOLE-TRIMETHOPRIM 800-160 MG PO TABS: 1.0000 | ORAL_TABLET | Freq: Two times a day (BID) | ORAL | Status: DC

## 2013-08-22 MED ORDER — HYDROMORPHONE HCL PF 1 MG/ML IJ SOLN
1.0000 mg | Freq: Once | INTRAMUSCULAR | Status: AC
  Administered 2013-08-22: 1 mg via INTRAMUSCULAR
  Filled 2013-08-22: qty 1

## 2013-08-22 MED ORDER — PREDNISONE 20 MG PO TABS: 40.0000 mg | ORAL_TABLET | Freq: Every day | ORAL | Status: DC

## 2013-08-22 MED ORDER — SULFAMETHOXAZOLE-TMP DS 800-160 MG PO TABS
1.0000 | ORAL_TABLET | Freq: Once | ORAL | Status: AC
  Administered 2013-08-22: 1 via ORAL
  Filled 2013-08-22: qty 1

## 2013-08-22 MED ORDER — DIPHENHYDRAMINE HCL 50 MG/ML IJ SOLN
25.0000 mg | Freq: Once | INTRAMUSCULAR | Status: AC
  Administered 2013-08-22: 25 mg via INTRAMUSCULAR
  Filled 2013-08-22: qty 1

## 2013-08-22 MED ORDER — OXYCODONE-ACETAMINOPHEN 5-325 MG PO TABS: 2.0000 | ORAL_TABLET | ORAL | Status: DC | PRN

## 2013-08-22 MED ORDER — ONDANSETRON 4 MG PO TBDP
4.0000 mg | ORAL_TABLET | Freq: Once | ORAL | Status: AC
  Administered 2013-08-22: 4 mg via ORAL
  Filled 2013-08-22: qty 1

## 2013-08-22 MED ORDER — CEPHALEXIN 250 MG PO CAPS
500.0000 mg | ORAL_CAPSULE | Freq: Once | ORAL | Status: AC
  Administered 2013-08-22: 500 mg via ORAL
  Filled 2013-08-22: qty 2

## 2013-08-22 MED ORDER — CEPHALEXIN 500 MG PO CAPS: 500.0000 mg | ORAL_CAPSULE | Freq: Four times a day (QID) | ORAL | Status: DC

## 2013-08-22 MED ORDER — KETOROLAC TROMETHAMINE 60 MG/2ML IM SOLN
60.0000 mg | Freq: Once | INTRAMUSCULAR | Status: AC
  Administered 2013-08-22: 60 mg via INTRAMUSCULAR
  Filled 2013-08-22: qty 2

## 2013-08-22 NOTE — ED Notes (Signed)
Pt presents with swelling, tenderness and redness to left breast day x 4 days. Pain on palpation. Denies discharge, fever, night chills.

## 2013-08-22 NOTE — ED Notes (Signed)
Pt reports soreness to L breast since Sunday. Monday she began to have redness and warmth to breast. Since Tuesday shes had severe pain in this L breast, redness is spreading across the breast, nipple discharge, chills and nausea
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
## 2013-08-29 NOTE — ED Provider Notes (Signed)
Medical screening examination/treatment/procedure(s) were performed by non-physician practitioner and as supervising physician I was immediately available for consultation/collaboration.   Roney Marion, MD 08/29/13 1622

## 2013-08-30 ENCOUNTER — Encounter (HOSPITAL_COMMUNITY): Payer: Self-pay | Admitting: Physical Medicine and Rehabilitation

## 2013-08-30 ENCOUNTER — Emergency Department (HOSPITAL_COMMUNITY): Payer: Self-pay

## 2013-08-30 ENCOUNTER — Emergency Department (HOSPITAL_COMMUNITY)
Admission: EM | Admit: 2013-08-30 | Discharge: 2013-08-30 | Disposition: A | Discharge status: Home / Self Care | Payer: Self-pay | Attending: Emergency Medicine | Admitting: Emergency Medicine

## 2013-08-30 DIAGNOSIS — G8929 Other chronic pain: Secondary | ICD-10-CM

## 2013-08-30 DIAGNOSIS — Z8619 Personal history of other infectious and parasitic diseases: Secondary | ICD-10-CM | POA: Insufficient documentation

## 2013-08-30 DIAGNOSIS — Z862 Personal history of diseases of the blood and blood-forming organs and certain disorders involving the immune mechanism: Secondary | ICD-10-CM | POA: Insufficient documentation

## 2013-08-30 DIAGNOSIS — Z8659 Personal history of other mental and behavioral disorders: Secondary | ICD-10-CM | POA: Insufficient documentation

## 2013-08-30 DIAGNOSIS — IMO0002 Reserved for concepts with insufficient information to code with codable children: Secondary | ICD-10-CM | POA: Insufficient documentation

## 2013-08-30 DIAGNOSIS — R1084 Generalized abdominal pain: Secondary | ICD-10-CM | POA: Insufficient documentation

## 2013-08-30 DIAGNOSIS — R112 Nausea with vomiting, unspecified: Secondary | ICD-10-CM | POA: Insufficient documentation

## 2013-08-30 DIAGNOSIS — R110 Nausea: Secondary | ICD-10-CM

## 2013-08-30 DIAGNOSIS — Z87448 Personal history of other diseases of urinary system: Secondary | ICD-10-CM | POA: Insufficient documentation

## 2013-08-30 DIAGNOSIS — Z8679 Personal history of other diseases of the circulatory system: Secondary | ICD-10-CM | POA: Insufficient documentation

## 2013-08-30 DIAGNOSIS — R55 Syncope and collapse: Secondary | ICD-10-CM

## 2013-08-30 DIAGNOSIS — Z8719 Personal history of other diseases of the digestive system: Secondary | ICD-10-CM | POA: Insufficient documentation

## 2013-08-30 DIAGNOSIS — F17200 Nicotine dependence, unspecified, uncomplicated: Secondary | ICD-10-CM | POA: Insufficient documentation

## 2013-08-30 DIAGNOSIS — Z8669 Personal history of other diseases of the nervous system and sense organs: Secondary | ICD-10-CM | POA: Insufficient documentation

## 2013-08-30 LAB — URINALYSIS, ROUTINE W REFLEX MICROSCOPIC
Glucose, UA: NEGATIVE mg/dL
Ketones, ur: NEGATIVE mg/dL
Leukocytes, UA: NEGATIVE
Nitrite: NEGATIVE
Protein, ur: NEGATIVE mg/dL
Urobilinogen, UA: 0.2 mg/dL (ref 0.0–1.0)

## 2013-08-30 LAB — CBC WITH DIFFERENTIAL/PLATELET
Basophils Relative: 0 % (ref 0–1)
HCT: 45 % (ref 36.0–46.0)
Hemoglobin: 15 g/dL (ref 12.0–15.0)
Lymphocytes Relative: 17 % (ref 12–46)
Lymphs Abs: 3.3 10*3/uL (ref 0.7–4.0)
MCH: 27.8 pg (ref 26.0–34.0)
MCHC: 33.3 g/dL (ref 30.0–36.0)
Monocytes Absolute: 1.3 10*3/uL — ABNORMAL HIGH (ref 0.1–1.0)
Monocytes Relative: 7 % (ref 3–12)
Neutro Abs: 14.4 10*3/uL — ABNORMAL HIGH (ref 1.7–7.7)
Platelets: 216 10*3/uL (ref 150–400)
RBC: 5.4 MIL/uL — ABNORMAL HIGH (ref 3.87–5.11)

## 2013-08-30 LAB — COMPREHENSIVE METABOLIC PANEL
AST: 18 U/L (ref 0–37)
Albumin: 4.8 g/dL (ref 3.5–5.2)
BUN: 12 mg/dL (ref 6–23)
CO2: 26 mEq/L (ref 19–32)
Chloride: 100 mEq/L (ref 96–112)
Creatinine, Ser: 0.8 mg/dL (ref 0.50–1.10)
GFR calc Af Amer: >90 mL/min (ref >90)
GFR calc non Af Amer: >90 mL/min (ref >90)
Glucose, Bld: 87 mg/dL (ref 70–99)
Total Bilirubin: 0.2 mg/dL — ABNORMAL LOW (ref 0.3–1.2)

## 2013-08-30 LAB — LIPASE, BLOOD: Lipase: 28 U/L (ref 11–59)

## 2013-08-30 MED ORDER — HYDROMORPHONE HCL PF 1 MG/ML IJ SOLN
1.0000 mg | Freq: Once | INTRAMUSCULAR | Status: AC
  Administered 2013-08-30: 1 mg via INTRAVENOUS
  Filled 2013-08-30: qty 1

## 2013-08-30 MED ORDER — OXYCODONE-ACETAMINOPHEN 5-325 MG PO TABS: 1.0000 | ORAL_TABLET | ORAL | Status: DC | PRN

## 2013-08-30 MED ORDER — ONDANSETRON HCL 4 MG/2ML IJ SOLN
4.0000 mg | Freq: Once | INTRAMUSCULAR | Status: AC
  Administered 2013-08-30: 4 mg via INTRAVENOUS
  Filled 2013-08-30: qty 2

## 2013-08-30 MED ORDER — IOHEXOL 300 MG/ML  SOLN
25.0000 mL | INTRAMUSCULAR | Status: DC | PRN
  Administered 2013-08-30: 80 mL via ORAL

## 2013-08-30 MED ORDER — SODIUM CHLORIDE 0.9 % IV BOLUS (SEPSIS)
1000.0000 mL | Freq: Once | INTRAVENOUS | Status: AC
  Administered 2013-08-30: 1000 mL via INTRAVENOUS

## 2013-08-30 MED ORDER — MORPHINE SULFATE 4 MG/ML IJ SOLN
4.0000 mg | Freq: Once | INTRAMUSCULAR | Status: AC
  Administered 2013-08-30: 4 mg via INTRAVENOUS
  Filled 2013-08-30: qty 1

## 2013-08-30 MED ORDER — ONDANSETRON 4 MG PO TBDP: 4.0000 mg | ORAL_TABLET | Freq: Three times a day (TID) | ORAL | Status: DC | PRN

## 2013-08-30 NOTE — ED Notes (Signed)
Patient advised that there was only a slight decrease in pain from the dilaudid.   Patient sitting up in bed, talking and texting on phone.   Patient tearful.  Patient states she is upset about "not knowing what the problem is".  Patient reassured.

## 2013-08-30 NOTE — ED Notes (Signed)
Patient currently ambulating to restroom with tech.

## 2013-08-30 NOTE — ED Notes (Signed)
Pt knows we need a urine sample but is unable at this time 

## 2013-08-30 NOTE — ED Provider Notes (Signed)
27 year old female, history of recurrent abdominal pain which is chronic in nature, was treated in the past and diagnosed as irritable bowel syndrome after a hysterectomy did not fix her symptoms. She states that she has had 3 days of recurrent ongoing worsening abdominal pain located primarily in the left lower quadrant. She has had nausea and no appetite and has not had anything to eat in 3 days according to her report. She denies fevers or chills, she has not had any difficult and passing urine but has not had much urine today either. She had a normal brown stool is morning without blood or diarrhea. On my exam she has a soft abdomen which is tender in the left lower quadrant with mild guarding, no other tenderness in the abdomen of any significance. It is non-peritoneal, nonsurgical, heart and lungs are clear, mucous membranes appear dry. The patient does appear to be dehydrated, she will need IV fluid rehydration, labs show leukocytosis, urinalysis is pending, possible CT scan to evaluate for diverticulitis if no urine source.  Medical screening examination/treatment/procedure(s) were conducted as a shared visit with non-physician practitioner(s) and myself.  I personally evaluated the patient during the encounter.        Vida Roller, MD 09/02/13 [phone]

## 2013-08-30 NOTE — ED Notes (Signed)
Patient understands that we need urine.  She advised cannot provide at this time.

## 2013-08-30 NOTE — ED Notes (Signed)
Pt presents to department for evaluation of syncope. States she passed out x2 this morning. Also states pain to chest, LLQ and bilateral legs. Also states nausea/vomiting and decreased appetite. Pt is conscious alert and oriented x4. Respirations unlabored.

## 2013-08-30 NOTE — ED Notes (Signed)
Spoke with lab, they advised that they will add on the lipase to the light green tube they have already.
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
## 2013-09-02 NOTE — ED Provider Notes (Signed)
Medical screening examination/treatment/procedure(s) were conducted as a shared visit with non-physician practitioner(s) and myself.  I personally evaluated the patient during the encounter  Please see my separate respective documentation pertaining to this patient encounter   Vida Roller, MD 09/02/13 [phone]

## 2013-09-03 ENCOUNTER — Encounter (HOSPITAL_COMMUNITY): Payer: Self-pay | Admitting: Nurse Practitioner

## 2013-09-03 ENCOUNTER — Emergency Department (HOSPITAL_COMMUNITY)
Admission: EM | Admit: 2013-09-03 | Discharge: 2013-09-03 | Disposition: A | Discharge status: Home / Self Care | Payer: Self-pay | Attending: Emergency Medicine | Admitting: Emergency Medicine

## 2013-09-03 ENCOUNTER — Emergency Department (HOSPITAL_COMMUNITY): Payer: Self-pay

## 2013-09-03 DIAGNOSIS — R5381 Other malaise: Secondary | ICD-10-CM | POA: Insufficient documentation

## 2013-09-03 DIAGNOSIS — I493 Ventricular premature depolarization: Secondary | ICD-10-CM

## 2013-09-03 DIAGNOSIS — R0789 Other chest pain: Secondary | ICD-10-CM | POA: Insufficient documentation

## 2013-09-03 DIAGNOSIS — Z8742 Personal history of other diseases of the female genital tract: Secondary | ICD-10-CM | POA: Insufficient documentation

## 2013-09-03 DIAGNOSIS — R0602 Shortness of breath: Secondary | ICD-10-CM | POA: Insufficient documentation

## 2013-09-03 DIAGNOSIS — Z8659 Personal history of other mental and behavioral disorders: Secondary | ICD-10-CM | POA: Insufficient documentation

## 2013-09-03 DIAGNOSIS — Z8619 Personal history of other infectious and parasitic diseases: Secondary | ICD-10-CM | POA: Insufficient documentation

## 2013-09-03 DIAGNOSIS — I4949 Other premature depolarization: Secondary | ICD-10-CM | POA: Insufficient documentation

## 2013-09-03 DIAGNOSIS — Z8719 Personal history of other diseases of the digestive system: Secondary | ICD-10-CM | POA: Insufficient documentation

## 2013-09-03 DIAGNOSIS — R42 Dizziness and giddiness: Secondary | ICD-10-CM | POA: Insufficient documentation

## 2013-09-03 DIAGNOSIS — R109 Unspecified abdominal pain: Secondary | ICD-10-CM | POA: Insufficient documentation

## 2013-09-03 DIAGNOSIS — R112 Nausea with vomiting, unspecified: Secondary | ICD-10-CM | POA: Insufficient documentation

## 2013-09-03 DIAGNOSIS — R079 Chest pain, unspecified: Secondary | ICD-10-CM

## 2013-09-03 DIAGNOSIS — R55 Syncope and collapse: Secondary | ICD-10-CM | POA: Insufficient documentation

## 2013-09-03 DIAGNOSIS — Z792 Long term (current) use of antibiotics: Secondary | ICD-10-CM | POA: Insufficient documentation

## 2013-09-03 DIAGNOSIS — R002 Palpitations: Secondary | ICD-10-CM | POA: Insufficient documentation

## 2013-09-03 DIAGNOSIS — R630 Anorexia: Secondary | ICD-10-CM | POA: Insufficient documentation

## 2013-09-03 DIAGNOSIS — D649 Anemia, unspecified: Secondary | ICD-10-CM | POA: Insufficient documentation

## 2013-09-03 DIAGNOSIS — Z79899 Other long term (current) drug therapy: Secondary | ICD-10-CM | POA: Insufficient documentation

## 2013-09-03 DIAGNOSIS — F17200 Nicotine dependence, unspecified, uncomplicated: Secondary | ICD-10-CM | POA: Insufficient documentation

## 2013-09-03 LAB — BASIC METABOLIC PANEL
BUN: 11 mg/dL (ref 6–23)
CO2: 31 mEq/L (ref 19–32)
Calcium: 9.8 mg/dL (ref 8.4–10.5)
Chloride: 101 mEq/L (ref 96–112)
Creatinine, Ser: 0.79 mg/dL (ref 0.50–1.10)
Glucose, Bld: 74 mg/dL (ref 70–99)

## 2013-09-03 LAB — D-DIMER, QUANTITATIVE: D-Dimer, Quant: <0.27 ug/mL-FEU (ref 0.00–0.48)

## 2013-09-03 LAB — LIPASE, BLOOD: Lipase: 28 U/L (ref 11–59)

## 2013-09-03 LAB — HEPATIC FUNCTION PANEL
Alkaline Phosphatase: 71 U/L (ref 39–117)
Bilirubin, Direct: <0.1 mg/dL (ref 0.0–0.3)
Total Bilirubin: 0.1 mg/dL — ABNORMAL LOW (ref 0.3–1.2)

## 2013-09-03 LAB — CBC
HCT: 43.4 % (ref 36.0–46.0)
MCHC: 32.3 g/dL (ref 30.0–36.0)
MCV: 84.9 fL (ref 78.0–100.0)
RDW: 14.6 % (ref 11.5–15.5)

## 2013-09-03 MED ORDER — SODIUM CHLORIDE 0.9 % IV BOLUS (SEPSIS)
1000.0000 mL | Freq: Once | INTRAVENOUS | Status: AC
  Administered 2013-09-03: 1000 mL via INTRAVENOUS

## 2013-09-03 MED ORDER — OXYCODONE-ACETAMINOPHEN 5-325 MG PO TABS: 1.0000 | ORAL_TABLET | ORAL | Status: DC | PRN

## 2013-09-03 MED ORDER — METOCLOPRAMIDE HCL 10 MG PO TABS: 10.0000 mg | ORAL_TABLET | Freq: Four times a day (QID) | ORAL | Status: DC

## 2013-09-03 MED ORDER — ONDANSETRON HCL 4 MG/2ML IJ SOLN
4.0000 mg | Freq: Once | INTRAMUSCULAR | Status: AC
  Administered 2013-09-03: 4 mg via INTRAVENOUS
  Filled 2013-09-03: qty 2

## 2013-09-03 MED ORDER — DICYCLOMINE HCL 10 MG PO CAPS
10.0000 mg | ORAL_CAPSULE | Freq: Once | ORAL | Status: AC
  Administered 2013-09-03: 10 mg via ORAL
  Filled 2013-09-03: qty 1

## 2013-09-03 MED ORDER — HYDROMORPHONE HCL PF 1 MG/ML IJ SOLN
1.0000 mg | Freq: Once | INTRAMUSCULAR | Status: AC
  Administered 2013-09-03: 1 mg via INTRAVENOUS
  Filled 2013-09-03: qty 1

## 2013-09-03 MED ORDER — DICYCLOMINE HCL 20 MG PO TABS: 20.0000 mg | ORAL_TABLET | Freq: Two times a day (BID) | ORAL | Status: DC
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
## 2013-09-03 NOTE — ED Provider Notes (Signed)
Medical screening examination/treatment/procedure(s) were performed by non-physician practitioner and as supervising physician I was immediately available for consultation/collaboration. Elwood Bazinet, MD, FACEP   Marissa Lowrey L Annaly Skop, MD 09/03/13 2032 

## 2013-09-03 NOTE — ED Notes (Signed)
While obtaining orthostatic vitals, pt's heart rhythm was noted to go into brief bigeminy, frequent unifocal PVCs and skipped beats.  Provider notified, strip obtained.  Pt was symptomatic during position change, reporting lightheadedness.

## 2013-09-03 NOTE — ED Notes (Signed)
Pt reports she is being worked up for abd pain and swelling with no diagnosis. Since last night pt has been having more severe abd pain as well as CP, "tunnel vision," dizzy and states she "passed out." reports poor oral intake also.

## 2013-09-03 NOTE — ED Notes (Signed)
Pt brought back to room; pt getting undressed and into a gown at this time 

## 2013-09-09 ENCOUNTER — Emergency Department (HOSPITAL_COMMUNITY): Payer: Self-pay

## 2013-09-09 ENCOUNTER — Encounter (HOSPITAL_COMMUNITY): Payer: Self-pay | Admitting: Emergency Medicine

## 2013-09-09 ENCOUNTER — Emergency Department (HOSPITAL_COMMUNITY)
Admission: EM | Admit: 2013-09-09 | Discharge: 2013-09-09 | Disposition: A | Discharge status: Home / Self Care | Payer: Self-pay | Attending: Emergency Medicine | Admitting: Emergency Medicine

## 2013-09-09 DIAGNOSIS — Y9389 Activity, other specified: Secondary | ICD-10-CM | POA: Insufficient documentation

## 2013-09-09 DIAGNOSIS — S298XXA Other specified injuries of thorax, initial encounter: Secondary | ICD-10-CM | POA: Insufficient documentation

## 2013-09-09 DIAGNOSIS — Z8742 Personal history of other diseases of the female genital tract: Secondary | ICD-10-CM | POA: Insufficient documentation

## 2013-09-09 DIAGNOSIS — Z3202 Encounter for pregnancy test, result negative: Secondary | ICD-10-CM | POA: Insufficient documentation

## 2013-09-09 DIAGNOSIS — K589 Irritable bowel syndrome without diarrhea: Secondary | ICD-10-CM | POA: Insufficient documentation

## 2013-09-09 DIAGNOSIS — Z79899 Other long term (current) drug therapy: Secondary | ICD-10-CM | POA: Insufficient documentation

## 2013-09-09 DIAGNOSIS — H53149 Visual discomfort, unspecified: Secondary | ICD-10-CM | POA: Insufficient documentation

## 2013-09-09 DIAGNOSIS — D649 Anemia, unspecified: Secondary | ICD-10-CM | POA: Insufficient documentation

## 2013-09-09 DIAGNOSIS — G43909 Migraine, unspecified, not intractable, without status migrainosus: Secondary | ICD-10-CM | POA: Insufficient documentation

## 2013-09-09 DIAGNOSIS — R110 Nausea: Secondary | ICD-10-CM | POA: Insufficient documentation

## 2013-09-09 DIAGNOSIS — R55 Syncope and collapse: Secondary | ICD-10-CM | POA: Insufficient documentation

## 2013-09-09 DIAGNOSIS — S060X9A Concussion with loss of consciousness of unspecified duration, initial encounter: Secondary | ICD-10-CM | POA: Insufficient documentation

## 2013-09-09 DIAGNOSIS — F17200 Nicotine dependence, unspecified, uncomplicated: Secondary | ICD-10-CM | POA: Insufficient documentation

## 2013-09-09 DIAGNOSIS — Y9289 Other specified places as the place of occurrence of the external cause: Secondary | ICD-10-CM | POA: Insufficient documentation

## 2013-09-09 DIAGNOSIS — W1809XA Striking against other object with subsequent fall, initial encounter: Secondary | ICD-10-CM | POA: Insufficient documentation

## 2013-09-09 DIAGNOSIS — Z8659 Personal history of other mental and behavioral disorders: Secondary | ICD-10-CM | POA: Insufficient documentation

## 2013-09-09 DIAGNOSIS — Z8619 Personal history of other infectious and parasitic diseases: Secondary | ICD-10-CM | POA: Insufficient documentation

## 2013-09-09 DIAGNOSIS — Z792 Long term (current) use of antibiotics: Secondary | ICD-10-CM | POA: Insufficient documentation

## 2013-09-09 LAB — COMPREHENSIVE METABOLIC PANEL
Albumin: 4.2 g/dL (ref 3.5–5.2)
Alkaline Phosphatase: 71 U/L (ref 39–117)
BUN: 11 mg/dL (ref 6–23)
Calcium: 9.2 mg/dL (ref 8.4–10.5)
Chloride: 104 mEq/L (ref 96–112)
Creatinine, Ser: 0.64 mg/dL (ref 0.50–1.10)
GFR calc Af Amer: >90 mL/min (ref >90)
GFR calc non Af Amer: >90 mL/min (ref >90)
Glucose, Bld: 82 mg/dL (ref 70–99)
Total Bilirubin: 0.2 mg/dL — ABNORMAL LOW (ref 0.3–1.2)

## 2013-09-09 LAB — CBC
HCT: 42.9 % (ref 36.0–46.0)
Hemoglobin: 14.2 g/dL (ref 12.0–15.0)
MCH: 28 pg (ref 26.0–34.0)
MCHC: 33.1 g/dL (ref 30.0–36.0)
MCV: 84.6 fL (ref 78.0–100.0)
Platelets: 158 10*3/uL (ref 150–400)
RBC: 5.07 MIL/uL (ref 3.87–5.11)
RDW: 14.6 % (ref 11.5–15.5)
WBC: 9.9 10*3/uL (ref 4.0–10.5)

## 2013-09-09 LAB — URINALYSIS, ROUTINE W REFLEX MICROSCOPIC
Bilirubin Urine: NEGATIVE
Hgb urine dipstick: NEGATIVE
Ketones, ur: NEGATIVE mg/dL
Nitrite: NEGATIVE
pH: 6 (ref 5.0–8.0)

## 2013-09-09 LAB — RAPID URINE DRUG SCREEN, HOSP PERFORMED
Barbiturates: NOT DETECTED
Cocaine: NOT DETECTED
Tetrahydrocannabinol: POSITIVE — AB

## 2013-09-09 LAB — POCT PREGNANCY, URINE: Preg Test, Ur: NEGATIVE

## 2013-09-09 MED ORDER — DIPHENHYDRAMINE HCL 50 MG/ML IJ SOLN
25.0000 mg | Freq: Once | INTRAMUSCULAR | Status: AC
  Administered 2013-09-09: 20:00:00 via INTRAVENOUS
  Filled 2013-09-09: qty 1

## 2013-09-09 MED ORDER — KETOROLAC TROMETHAMINE 30 MG/ML IJ SOLN
30.0000 mg | Freq: Once | INTRAMUSCULAR | Status: AC
  Administered 2013-09-09: 30 mg via INTRAVENOUS
  Filled 2013-09-09: qty 1

## 2013-09-09 MED ORDER — PREDNISONE 20 MG PO TABS: ORAL_TABLET | ORAL | Status: DC

## 2013-09-09 MED ORDER — ONDANSETRON HCL 4 MG PO TABS: 4.0000 mg | ORAL_TABLET | Freq: Four times a day (QID) | ORAL | Status: DC

## 2013-09-09 MED ORDER — SODIUM CHLORIDE 0.9 % IV BOLUS (SEPSIS)
1000.0000 mL | Freq: Once | INTRAVENOUS | Status: AC
  Administered 2013-09-09: 1000 mL via INTRAVENOUS

## 2013-09-09 MED ORDER — METOCLOPRAMIDE HCL 5 MG/ML IJ SOLN
10.0000 mg | Freq: Once | INTRAMUSCULAR | Status: AC
  Administered 2013-09-09: 10 mg via INTRAVENOUS
  Filled 2013-09-09: qty 2

## 2013-09-09 MED ORDER — SODIUM CHLORIDE 0.9 % IV BOLUS (SEPSIS): 500.0000 mL | Freq: Once | INTRAVENOUS | Status: DC

## 2013-09-09 NOTE — ED Notes (Signed)
Introduced self to patient.  Currently tearful "I'm so tired of this."  Reports began new medication 4 days ago, took for 2 days and broke out worse.  Stopped the med after 2 days. meds are dicyclomine and metoclopramide.

## 2013-09-09 NOTE — ED Provider Notes (Signed)
Medical screening examination/treatment/procedure(s) were performed by non-physician practitioner and as supervising physician I was immediately available for consultation/collaboration.   Layla Maw Ferron Ishmael, DO 09/09/13 2344

## 2013-09-09 NOTE — ED Notes (Signed)
To ED from home via EMS, has had 2 syncopal episodes today (1 witnessed by family) with hx of same, c/o nausea, fatigue, no trauma, no orthostatic changes per EMS, 5-6 PVCs per min, VSS, 123/96, 65, 18, 18g Right AC, 4 mg Zofran pta, CBG 65, NAD
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
## 2013-09-10 ENCOUNTER — Encounter: Payer: Self-pay | Admitting: Gastroenterology

## 2013-09-17 ENCOUNTER — Emergency Department (HOSPITAL_COMMUNITY)
Admission: EM | Admit: 2013-09-17 | Discharge: 2013-09-17 | Disposition: A | Discharge status: Home / Self Care | Payer: Self-pay | Attending: Emergency Medicine | Admitting: Emergency Medicine

## 2013-09-17 ENCOUNTER — Encounter (HOSPITAL_COMMUNITY): Payer: Self-pay | Admitting: Emergency Medicine

## 2013-09-17 DIAGNOSIS — R220 Localized swelling, mass and lump, head: Secondary | ICD-10-CM | POA: Insufficient documentation

## 2013-09-17 DIAGNOSIS — D649 Anemia, unspecified: Secondary | ICD-10-CM | POA: Insufficient documentation

## 2013-09-17 DIAGNOSIS — Z8659 Personal history of other mental and behavioral disorders: Secondary | ICD-10-CM | POA: Insufficient documentation

## 2013-09-17 DIAGNOSIS — F17200 Nicotine dependence, unspecified, uncomplicated: Secondary | ICD-10-CM | POA: Insufficient documentation

## 2013-09-17 DIAGNOSIS — L259 Unspecified contact dermatitis, unspecified cause: Secondary | ICD-10-CM | POA: Insufficient documentation

## 2013-09-17 DIAGNOSIS — Z79899 Other long term (current) drug therapy: Secondary | ICD-10-CM | POA: Insufficient documentation

## 2013-09-17 DIAGNOSIS — L309 Dermatitis, unspecified: Secondary | ICD-10-CM

## 2013-09-17 DIAGNOSIS — Z8619 Personal history of other infectious and parasitic diseases: Secondary | ICD-10-CM | POA: Insufficient documentation

## 2013-09-17 DIAGNOSIS — K589 Irritable bowel syndrome without diarrhea: Secondary | ICD-10-CM | POA: Insufficient documentation

## 2013-09-17 DIAGNOSIS — Z8742 Personal history of other diseases of the female genital tract: Secondary | ICD-10-CM | POA: Insufficient documentation

## 2013-09-17 DIAGNOSIS — Z880 Allergy status to penicillin: Secondary | ICD-10-CM | POA: Insufficient documentation

## 2013-09-17 DIAGNOSIS — IMO0002 Reserved for concepts with insufficient information to code with codable children: Secondary | ICD-10-CM | POA: Insufficient documentation

## 2013-09-17 MED ORDER — KETOROLAC TROMETHAMINE 60 MG/2ML IM SOLN
60.0000 mg | Freq: Once | INTRAMUSCULAR | Status: AC
  Administered 2013-09-17: 60 mg via INTRAMUSCULAR
  Filled 2013-09-17: qty 2

## 2013-09-17 MED ORDER — TRIAMCINOLONE ACETONIDE 0.1 % EX CREA: TOPICAL_CREAM | Freq: Two times a day (BID) | CUTANEOUS | Status: DC

## 2013-09-17 MED ORDER — DOXYCYCLINE HYCLATE 100 MG PO CAPS: 100.0000 mg | ORAL_CAPSULE | Freq: Two times a day (BID) | ORAL | Status: DC

## 2013-09-17 NOTE — ED Notes (Signed)
Pt reports that she has exzema and has been having a problem with it. States that Saturday the areas started to get bad. Pt has red swollen rash all over her body and on her face. Rash noted around both eyes. States the skin is dry and cracking. Has tried creams at home with no relief.

## 2013-09-17 NOTE — ED Notes (Signed)
Walker, NP at bedside for evaluation. 

## 2013-09-17 NOTE — ED Provider Notes (Signed)
Medical screening examination/treatment/procedure(s) were conducted as a shared visit with non-physician practitioner(s) and myself.  I personally evaluated the patient during the encounter  Pt with diffuse severe eczema, possibly signs of secondary infection. Doxycycline, steroid cream. Advised Derm followup.   Charles B. Bernette Mayers, MD 09/17/13 [phone]
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
## 2013-09-17 NOTE — ED Notes (Signed)
Patient getting dressed.

## 2013-09-17 NOTE — ED Notes (Signed)
Bernette Mayers, MD and Dan Humphreys, NP at bedside for evaluation.

## 2013-10-02 ENCOUNTER — Encounter: Payer: Self-pay | Admitting: Cardiology

## 2013-10-03 ENCOUNTER — Ambulatory Visit: Payer: Self-pay | Admitting: Gastroenterology

## 2013-10-04 ENCOUNTER — Encounter: Payer: Self-pay | Admitting: Cardiology

## 2013-10-23 ENCOUNTER — Encounter: Payer: Self-pay | Admitting: Cardiology

## 2013-10-25 ENCOUNTER — Encounter: Payer: Self-pay | Admitting: Cardiology

## 2013-11-23 ENCOUNTER — Encounter (HOSPITAL_BASED_OUTPATIENT_CLINIC_OR_DEPARTMENT_OTHER): Payer: Self-pay | Admitting: Emergency Medicine

## 2013-11-23 ENCOUNTER — Emergency Department (HOSPITAL_BASED_OUTPATIENT_CLINIC_OR_DEPARTMENT_OTHER)
Admission: EM | Admit: 2013-11-23 | Discharge: 2013-11-23 | Disposition: A | Discharge status: Home / Self Care | Payer: Self-pay | Attending: Emergency Medicine | Admitting: Emergency Medicine

## 2013-11-23 ENCOUNTER — Emergency Department (HOSPITAL_BASED_OUTPATIENT_CLINIC_OR_DEPARTMENT_OTHER): Payer: Self-pay

## 2013-11-23 DIAGNOSIS — Z862 Personal history of diseases of the blood and blood-forming organs and certain disorders involving the immune mechanism: Secondary | ICD-10-CM | POA: Insufficient documentation

## 2013-11-23 DIAGNOSIS — S298XXA Other specified injuries of thorax, initial encounter: Secondary | ICD-10-CM | POA: Insufficient documentation

## 2013-11-23 DIAGNOSIS — Z8619 Personal history of other infectious and parasitic diseases: Secondary | ICD-10-CM | POA: Insufficient documentation

## 2013-11-23 DIAGNOSIS — Z8742 Personal history of other diseases of the female genital tract: Secondary | ICD-10-CM | POA: Insufficient documentation

## 2013-11-23 DIAGNOSIS — J209 Acute bronchitis, unspecified: Secondary | ICD-10-CM | POA: Insufficient documentation

## 2013-11-23 DIAGNOSIS — Z8679 Personal history of other diseases of the circulatory system: Secondary | ICD-10-CM | POA: Insufficient documentation

## 2013-11-23 DIAGNOSIS — F17200 Nicotine dependence, unspecified, uncomplicated: Secondary | ICD-10-CM | POA: Insufficient documentation

## 2013-11-23 DIAGNOSIS — R3919 Other difficulties with micturition: Secondary | ICD-10-CM | POA: Insufficient documentation

## 2013-11-23 DIAGNOSIS — Z8719 Personal history of other diseases of the digestive system: Secondary | ICD-10-CM | POA: Insufficient documentation

## 2013-11-23 DIAGNOSIS — Y92410 Unspecified street and highway as the place of occurrence of the external cause: Secondary | ICD-10-CM | POA: Insufficient documentation

## 2013-11-23 DIAGNOSIS — R630 Anorexia: Secondary | ICD-10-CM | POA: Insufficient documentation

## 2013-11-23 DIAGNOSIS — J40 Bronchitis, not specified as acute or chronic: Secondary | ICD-10-CM

## 2013-11-23 DIAGNOSIS — S3981XA Other specified injuries of abdomen, initial encounter: Secondary | ICD-10-CM | POA: Insufficient documentation

## 2013-11-23 DIAGNOSIS — Y9389 Activity, other specified: Secondary | ICD-10-CM | POA: Insufficient documentation

## 2013-11-23 DIAGNOSIS — J029 Acute pharyngitis, unspecified: Secondary | ICD-10-CM | POA: Insufficient documentation

## 2013-11-23 DIAGNOSIS — Z8659 Personal history of other mental and behavioral disorders: Secondary | ICD-10-CM | POA: Insufficient documentation

## 2013-11-23 LAB — URINALYSIS, ROUTINE W REFLEX MICROSCOPIC
Bilirubin Urine: NEGATIVE
Glucose, UA: NEGATIVE mg/dL
Hgb urine dipstick: NEGATIVE
Ketones, ur: 15 mg/dL — AB
Leukocytes, UA: NEGATIVE
Nitrite: NEGATIVE
Protein, ur: NEGATIVE mg/dL
Specific Gravity, Urine: 1.022 (ref 1.005–1.030)
Urobilinogen, UA: 0.2 mg/dL (ref 0.0–1.0)
pH: 6 (ref 5.0–8.0)

## 2013-11-23 MED ORDER — AZITHROMYCIN 250 MG PO TABS: 250.0000 mg | ORAL_TABLET | Freq: Every day | ORAL | Status: DC

## 2013-11-23 MED ORDER — GUAIFENESIN 100 MG/5ML PO SYRP: 100.0000 mg | ORAL_SOLUTION | ORAL | Status: DC | PRN

## 2013-11-23 MED ORDER — CYCLOBENZAPRINE HCL 10 MG PO TABS: 10.0000 mg | ORAL_TABLET | Freq: Two times a day (BID) | ORAL | Status: DC | PRN
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
## 2013-11-24 NOTE — ED Provider Notes (Signed)
Medical screening examination/treatment/procedure(s) were performed by non-physician practitioner and as supervising physician I was immediately available for consultation/collaboration.     Jeremy Ditullio, MD 11/24/13 1511 

## 2014-03-14 ENCOUNTER — Encounter (HOSPITAL_COMMUNITY): Payer: Self-pay | Admitting: Emergency Medicine

## 2014-03-14 ENCOUNTER — Emergency Department (HOSPITAL_COMMUNITY)
Admission: EM | Admit: 2014-03-14 | Discharge: 2014-03-14 | Discharge status: Against Medical Advice | Payer: No Typology Code available for payment source | Attending: Emergency Medicine | Admitting: Emergency Medicine

## 2014-03-14 DIAGNOSIS — R209 Unspecified disturbances of skin sensation: Secondary | ICD-10-CM | POA: Insufficient documentation

## 2014-03-14 DIAGNOSIS — R110 Nausea: Secondary | ICD-10-CM | POA: Insufficient documentation

## 2014-03-14 DIAGNOSIS — K047 Periapical abscess without sinus: Secondary | ICD-10-CM | POA: Insufficient documentation

## 2014-03-14 DIAGNOSIS — H53149 Visual discomfort, unspecified: Secondary | ICD-10-CM | POA: Insufficient documentation

## 2014-03-14 DIAGNOSIS — R51 Headache: Secondary | ICD-10-CM | POA: Insufficient documentation

## 2014-03-14 DIAGNOSIS — F17200 Nicotine dependence, unspecified, uncomplicated: Secondary | ICD-10-CM | POA: Insufficient documentation

## 2014-03-14 NOTE — ED Notes (Signed)
Pt presents to ED with headache, nausea, and sensitivity to lights x3 weeks, intermittent numbness from Left elbow to hand and Left knee to foot starting today. Pt also reports abscess to Left upper tooth x2 days, states she "popped it and got a lot of junk out." no neuro deficits noted, grips and strengths are equal bilaterally, no arm drift, no facial droop but Left side facial swelling noted to upper cheek

## 2014-08-02 ENCOUNTER — Encounter (HOSPITAL_BASED_OUTPATIENT_CLINIC_OR_DEPARTMENT_OTHER): Payer: Self-pay | Admitting: Emergency Medicine

## 2014-08-02 ENCOUNTER — Emergency Department (HOSPITAL_BASED_OUTPATIENT_CLINIC_OR_DEPARTMENT_OTHER)
Admission: EM | Admit: 2014-08-02 | Discharge: 2014-08-02 | Disposition: A | Discharge status: Home / Self Care | Payer: No Typology Code available for payment source | Attending: Emergency Medicine | Admitting: Emergency Medicine

## 2014-08-02 DIAGNOSIS — Z8679 Personal history of other diseases of the circulatory system: Secondary | ICD-10-CM | POA: Insufficient documentation

## 2014-08-02 DIAGNOSIS — Z8659 Personal history of other mental and behavioral disorders: Secondary | ICD-10-CM | POA: Insufficient documentation

## 2014-08-02 DIAGNOSIS — Z8742 Personal history of other diseases of the female genital tract: Secondary | ICD-10-CM | POA: Insufficient documentation

## 2014-08-02 DIAGNOSIS — R51 Headache: Secondary | ICD-10-CM | POA: Insufficient documentation

## 2014-08-02 DIAGNOSIS — R300 Dysuria: Secondary | ICD-10-CM | POA: Insufficient documentation

## 2014-08-02 DIAGNOSIS — Z9889 Other specified postprocedural states: Secondary | ICD-10-CM | POA: Insufficient documentation

## 2014-08-02 DIAGNOSIS — F17200 Nicotine dependence, unspecified, uncomplicated: Secondary | ICD-10-CM | POA: Insufficient documentation

## 2014-08-02 DIAGNOSIS — Z8719 Personal history of other diseases of the digestive system: Secondary | ICD-10-CM | POA: Insufficient documentation

## 2014-08-02 DIAGNOSIS — Z862 Personal history of diseases of the blood and blood-forming organs and certain disorders involving the immune mechanism: Secondary | ICD-10-CM | POA: Insufficient documentation

## 2014-08-02 DIAGNOSIS — L259 Unspecified contact dermatitis, unspecified cause: Secondary | ICD-10-CM | POA: Insufficient documentation

## 2014-08-02 DIAGNOSIS — Z8619 Personal history of other infectious and parasitic diseases: Secondary | ICD-10-CM | POA: Insufficient documentation

## 2014-08-02 DIAGNOSIS — Z880 Allergy status to penicillin: Secondary | ICD-10-CM | POA: Insufficient documentation

## 2014-08-02 DIAGNOSIS — IMO0002 Reserved for concepts with insufficient information to code with codable children: Secondary | ICD-10-CM | POA: Insufficient documentation

## 2014-08-02 LAB — URINALYSIS, ROUTINE W REFLEX MICROSCOPIC
BILIRUBIN URINE: NEGATIVE
Glucose, UA: NEGATIVE mg/dL
Hgb urine dipstick: NEGATIVE
Ketones, ur: NEGATIVE mg/dL
LEUKOCYTES UA: NEGATIVE
Nitrite: NEGATIVE
PH: 5.5 (ref 5.0–8.0)
Protein, ur: NEGATIVE mg/dL
SPECIFIC GRAVITY, URINE: 1.026 (ref 1.005–1.030)
UROBILINOGEN UA: 0.2 mg/dL (ref 0.0–1.0)

## 2014-08-02 MED ORDER — ACETAMINOPHEN 325 MG PO TABS
975.0000 mg | ORAL_TABLET | Freq: Once | ORAL | Status: AC
  Administered 2014-08-02: 975 mg via ORAL

## 2014-08-02 MED ORDER — ACETAMINOPHEN 325 MG PO TABS
ORAL_TABLET | ORAL | Status: AC
  Administered 2014-08-02: 10:00:00 975 mg via ORAL
  Filled 2014-08-02: qty 3

## 2014-08-02 MED ORDER — PHENAZOPYRIDINE HCL 200 MG PO TABS: 200.0000 mg | ORAL_TABLET | Freq: Three times a day (TID) | ORAL | Status: DC | PRN

## 2014-08-02 NOTE — Discharge Instructions (Signed)
Eczema Use hydrocortisone cream 1% as directed . Apply to your rash. You can take Benadryl 50 mg 4 times daily as needed for itch. Take Tylenol as directed for pain or headache. Take the medicine prescribed for discomfort with urination. Call any of the numbers on the resource guide to get a primary care physician Eczema, also called atopic dermatitis, is a skin disorder that causes inflammation of the skin. It causes a red rash and dry, scaly skin. The skin becomes very itchy. Eczema is generally worse during the cooler winter months and often improves with the warmth of summer. Eczema usually starts showing signs in infancy. Some children outgrow eczema, but it may last through adulthood.  CAUSES  The exact cause of eczema is not known, but it appears to run in families. People with eczema often have a family history of eczema, allergies, asthma, or hay fever. Eczema is not contagious. Flare-ups of the condition may be caused by:   Contact with something you are sensitive or allergic to.   Stress. SIGNS AND SYMPTOMS  Dry, scaly skin.   Red, itchy rash.   Itchiness. This may occur before the skin rash and may be very intense.  DIAGNOSIS  The diagnosis of eczema is usually made based on symptoms and medical history. TREATMENT  Eczema cannot be cured, but symptoms usually can be controlled with treatment and other strategies. A treatment plan might include:  Controlling the itching and scratching.   Use over-the-counter antihistamines as directed for itching. This is especially useful at night when the itching tends to be worse.   Use over-the-counter steroid creams as directed for itching.   Avoid scratching. Scratching makes the rash and itching worse. It may also result in a skin infection (impetigo) due to a break in the skin caused by scratching.   Keeping the skin well moisturized with creams every day. This will seal in moisture and help prevent dryness. Lotions that  contain alcohol and water should be avoided because they can dry the skin.   Limiting exposure to things that you are sensitive or allergic to (allergens).   Recognizing situations that cause stress.   Developing a plan to manage stress.  HOME CARE INSTRUCTIONS   Only take over-the-counter or prescription medicines as directed by your health care provider.   Do not use anything on the skin without checking with your health care provider.   Keep baths or showers short (5 minutes) in warm (not hot) water. Use mild cleansers for bathing. These should be unscented. You may add nonperfumed bath oil to the bath water. It is best to avoid soap and bubble bath.   Immediately after a bath or shower, when the skin is still damp, apply a moisturizing ointment to the entire body. This ointment should be a petroleum ointment. This will seal in moisture and help prevent dryness. The thicker the ointment, the better. These should be unscented.   Keep fingernails cut short. Children with eczema may need to wear soft gloves or mittens at night after applying an ointment.   Dress in clothes made of cotton or cotton blends. Dress lightly, because heat increases itching.   A child with eczema should stay away from anyone with fever blisters or cold sores. The virus that causes fever blisters (herpes simplex) can cause a serious skin infection in children with eczema. SEEK MEDICAL CARE IF:   Your itching interferes with sleep.   Your rash gets worse or is not better within 1 week  after starting treatment.   You see pus or soft yellow scabs in the rash area.   You have a fever.   You have a rash flare-up after contact with someone who has fever blisters.  Document Released: 11/11/2000 Document Revised: 09/04/2013 Document Reviewed: 06/17/2013 [facility] Patient Information 2015 [place], [place]. This information is not intended to replace advice given to you by your health care provider. Make  sure you discuss any questions you have with your health care provider.  Emergency Department Resource Guide 1) Find a Doctor and Pay Out of Pocket Although you won't have to find out who is covered by your insurance plan, it is a good idea to ask around and get recommendations. You will then need to call the office and see if the doctor you have chosen will accept you as a new patient and what types of options they offer for patients who are self-pay. Some doctors offer discounts or will set up payment plans for their patients who do not have insurance, but you will need to ask so you aren't surprised when you get to your appointment.  2) Contact Your Local Health Department Not all health departments have doctors that can see patients for sick visits, but many do, so it is worth a call to see if yours does. If you don't know where your local health department is, you can check in your phone book. The CDC also has a tool to help you locate your state's health department, and many state websites also have listings of all of their local health departments.  3) Find a Walk-in Clinic If your illness is not likely to be very severe or complicated, you may want to try a walk in clinic. These are popping up all over the country in pharmacies, drugstores, and shopping centers. They're usually staffed by nurse practitioners or physician assistants that have been trained to treat common illnesses and complaints. They're usually fairly quick and inexpensive. However, if you have serious medical issues or chronic medical problems, these are probably not your best option.  No Primary Care Doctor: - Call Health Connect at  [phone] - they can help you locate a primary care doctor that  accepts your insurance, provides certain services, etc. - Physician Referral Service- [phone]  Chronic Pain Problems: Organization         Address  Phone   Notes  [facility]  [phone] Patients  need to be referred by their primary care doctor.   Medication Assistance: Organization         Address  Phone   Notes  [facility] Medication [facility] [address] [place], [place] [address] [phone] --Must be a resident of [facility] -- Must have NO insurance coverage whatsoever (no Medicaid/ Medicare, etc.) -- The pt. MUST have a primary care doctor that directs their care regularly and follows them in the community   [facility]  [phone]   [employer]  [phone]    Agencies that provide inexpensive medical care: Organization         Address  Phone   Notes  [facility]  [phone]   [facility]    [phone]   [facility] [address] [place], [place] [address] [phone]   [facility] [address] [place] [place] [phone]   Planned Parenthood    [phone]   [facility]    [phone]   [facility]  [address], [place] Phone:  [phone], Fax:  [phone] Hours of Operation:  9 am - 6 pm, M-F.  Also accepts Medicaid/Medicare and self-pay.  [facility]  [address], [place] Phone: [phone], Fax: [phone]. Hours of Operation:  8:30 am - 5:30 pm, M-F.  Also accepts Medicaid and self-pay.  [facility] [place] [address] [place] Phone: [phone]   [facility] [address] Natasha Bence [place], [place] [phone], Ext. [phone] Mondays & Thursdays: 7-9 AM.  First 15 patients are seen on a first come, first serve basis.    Medicaid-accepting [facility] Providers:  Organization         Address  Phone   Notes  [facility] [address], [place] [phone] Also accepts self-pay patients.  [facility] [address] Laurell Josephs [place], [place]  [phone]     [facility] [address], [place] [phone]   [facility] [address] [place] [phone]   Renaye Rakers [address], [place]   [phone] Only accepts [place] Access [place] patients after they have their name applied to their card.   Self-Pay (no insurance) in [facility]:  Organization         Address  Phone   Notes  Sickle Cell Patients, [facility] Internal Medicine [address] [place], [place] [phone]   [facility] [address] [place], [place] [phone]   [facility] [place]  [address], [place] [phone]   [facility]/Dr. Osei-Bonsu  [address] [place] or [address], [place] [phone] Phone number for both [place] and [place] locations is the same.  [facility] [address], [place] [phone]   [facility] [address] [place] or [address] [phone]   [facility] [address], [place] [phone], phone; [phone], fax Sees patients 1st and 3rd Saturday of every month.  Must not qualify for public or private insurance (i.e. Medicaid, Medicare, [place] Health Choice, Veterans' Benefits)  Household income should be no more than 200% of the poverty level The clinic cannot treat you if you are pregnant or think you are pregnant  Sexually transmitted diseases are not treated at the clinic.    Dental Care: Organization         Address  Phone  Notes  [facility] Department of [facility] [address] [place], [place] [phone] Accepts children up to age 3 who are enrolled in [place] or [place] Health Choice; pregnant women with a Medicaid card; and children who have applied for Medicaid or [place] Health Choice, but were declined,  whose parents can pay a reduced fee at time of service.  [facility] Department of [facility]  [address], [place] [phone] Accepts children up to age 78 who are enrolled in [place] or [place] Health Choice; pregnant women with a Medicaid card; and children who have applied for Medicaid or  Health Choice, but were declined, whose parents can pay a reduced fee at time of  service.  [facility]  [address] [place], [place] [phone] Patients are seen by appointment only. Walk-ins are not accepted. [facility] will see patients 58 years of age and older. Monday - Tuesday (8am-5pm) Most Wednesdays (8:30-5pm) $30 per visit, cash only  [facility]  [address], [facility] [phone] Patients are seen by appointment only. Walk-ins are not accepted. [facility] will see patients 31 years of age and older. One Wednesday Evening (Monthly: Volunteer Based).  $30 per visit, cash only  [employer]  [phone] for adults; Children under age 47, call Graduate Pediatric Dentistry at [phone]. Children aged 4-14, please call [phone] to request a pediatric application.  Dental services are provided in all areas of dental care including fillings, crowns and bridges, complete and partial dentures, implants, gum treatment, root canals, and extractions. Preventive care is also provided. Treatment is provided to both adults and children. Patients are selected via a lottery and there is often a waiting list.   [facility] [address] [place]  [phone] [email or web address]   [facility] [address] [place], [place] [phone], Ext. [phone] Second and Fourth Thursday of each month, opens at 6:30 AM; Clinic ends at 9 AM.  Patients are seen on a first-come first-served basis, and a limited number are seen during each clinic.   [facility]  [address] Ether Griffins [place], [place] [phone]   Eligibility Requirements You must have lived in [place], [place], or [place] counties for at least the last three months.   You cannot be eligible for state or federal sponsored [place], including [employer], [place], or [employer].   You generally cannot be eligible for healthcare insurance through your employer.    How to apply: Eligibility screenings are held every Tuesday and Wednesday afternoon from 1:00 pm until 4:00 pm. You do not need an appointment for the interview!  [facility] [address] [place], [place] [phone]   [facility] Health Department  [phone]   [facility] Health Department  [phone]   [facility] Health Department  [phone]    Behavioral Health Resources in the Community: Intensive Outpatient Programs Organization         Address  Phone  Notes  [facility] [address], [place], [place] [phone]   [facility] Outpatient [address] [place], [place] [phone]   [facility] [address] [place], [place]  [phone]   [facility] Mental Health [address],  [place], [place] [phone] or [phone]   Substance Abuse Resources Organization         Address  Phone  Notes  [facility]  [phone]   [facility]  [phone]   [facility] [place] [facility]  [phone]   Floydene Flock  [phone]   Residential & Outpatient Substance Abuse Program  [phone]   Psychological Services Organization         Address  Phone  Notes  [facility] Behavioral Health  [phone]   [facility] Services  [phone]   [facility] Mental Health [address], [place] [phone] or [phone]    Mobile Crisis Teams Organization         Address  Phone  Notes  [facility], Mobile Crisis Care Unit   [phone]   [facility]  [address] [place], [place] [phone]   [facility] [address] [place] [phone]    Self-Help/Support Groups Organization         Address  Phone             Notes  [facility] [place] - variety of support groups  [phone]- [phone] Call for more information  Narcotics Anonymous (NA), [facility] [address], [employer] [place]  2 meetings at this location   Statistician         Address  Phone  Notes  [facility] [address],    [place]  [phone]   [facility]  [address] [place] [address], [place], [place] [phone]   [facility] Treatment Facility [address] [place], [place] [phone] Admissions: 8am-3pm M-F  [facility] [address]    [place], [place] [phone]   [facility] [address] [place], [place], [place] [phone]   [facility] [address],  [place], [place] [phone]   [facility] - Intensive Outpatient [address] Laurell Josephs [address], [place], [place] [phone]   [facility] ([facility] [address] [place]  [place], [place] [phone] or [phone]   [facility] ([facility]) [address] [place], [place] [phone] Accepts Medicaid  [facility] [address],  [place] [phone] Substance Abuse/Addiction Treatment   [facility] Organization         Address  Phone  Notes  [facility]  [phone]   Angie Fava, PhD [address] Ervin Knack [place], [place]   [phone] or [phone]   [facility] [place] [facility] Behavioral   [address] [place], [place] [phone]   [facility] [address] [place], [place] [phone] Insurance/Medicaid/sponsorship through [facility] [address]                                     [place], [place] [phone] Therapy/tele-psych/case  [facility] [address], [place] [phone]    Dr. Lolly Mustache  [phone]   [facility]  [employer] [facility] Dept. 1) [address] [place] 2) [address], [place] 3)  [address], [place] [phone]  [phone]   [facility] Child Abuse Hotline (720)655-3231 or [phone] (After Hours)

## 2014-08-02 NOTE — ED Notes (Signed)
Patient drinking sprite.  
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
## 2014-08-02 NOTE — ED Notes (Addendum)
Patient here with dysuria that she describes as a burning discomfort. With frequency for 4 days. Also complains of lower abdominal pain. Also wants eczema checked to arms and legs, the cream she has is not working and reports that the eczema is very painful, taking oatmeal meds with no relief

## 2014-09-29 ENCOUNTER — Encounter (HOSPITAL_BASED_OUTPATIENT_CLINIC_OR_DEPARTMENT_OTHER): Payer: Self-pay | Admitting: Emergency Medicine

## 2014-10-03 ENCOUNTER — Telehealth: Payer: Self-pay | Admitting: Family Medicine

## 2014-10-03 NOTE — Telephone Encounter (Signed)
Pt given appt with dr Hyacinth Meekermiller 1/14 @ 11:30

## 2014-11-05 ENCOUNTER — Emergency Department (HOSPITAL_COMMUNITY)
Admission: EM | Admit: 2014-11-05 | Discharge: 2014-11-05 | Disposition: A | Discharge status: Home / Self Care | Payer: MEDICAID | Attending: Emergency Medicine | Admitting: Emergency Medicine

## 2014-11-05 ENCOUNTER — Encounter (HOSPITAL_COMMUNITY): Payer: Self-pay | Admitting: Emergency Medicine

## 2014-11-05 DIAGNOSIS — Z8719 Personal history of other diseases of the digestive system: Secondary | ICD-10-CM | POA: Diagnosis not present

## 2014-11-05 DIAGNOSIS — Z880 Allergy status to penicillin: Secondary | ICD-10-CM | POA: Insufficient documentation

## 2014-11-05 DIAGNOSIS — F1110 Opioid abuse, uncomplicated: Secondary | ICD-10-CM

## 2014-11-05 DIAGNOSIS — Z720 Tobacco use: Secondary | ICD-10-CM | POA: Insufficient documentation

## 2014-11-05 DIAGNOSIS — Z8619 Personal history of other infectious and parasitic diseases: Secondary | ICD-10-CM | POA: Insufficient documentation

## 2014-11-05 DIAGNOSIS — F1193 Opioid use, unspecified with withdrawal: Secondary | ICD-10-CM | POA: Diagnosis not present

## 2014-11-05 DIAGNOSIS — Z8742 Personal history of other diseases of the female genital tract: Secondary | ICD-10-CM | POA: Diagnosis not present

## 2014-11-05 DIAGNOSIS — Z8659 Personal history of other mental and behavioral disorders: Secondary | ICD-10-CM | POA: Insufficient documentation

## 2014-11-05 DIAGNOSIS — Z862 Personal history of diseases of the blood and blood-forming organs and certain disorders involving the immune mechanism: Secondary | ICD-10-CM | POA: Diagnosis not present

## 2014-11-05 DIAGNOSIS — Z79899 Other long term (current) drug therapy: Secondary | ICD-10-CM | POA: Diagnosis not present

## 2014-11-05 DIAGNOSIS — F1123 Opioid dependence with withdrawal: Secondary | ICD-10-CM

## 2014-11-05 LAB — BASIC METABOLIC PANEL
Anion gap: 12 (ref 5–15)
BUN: 18 mg/dL (ref 6–23)
CO2: 29 meq/L (ref 19–32)
Calcium: 9.8 mg/dL (ref 8.4–10.5)
Chloride: 98 mEq/L (ref 96–112)
Creatinine, Ser: 0.94 mg/dL (ref 0.50–1.10)
GFR calc Af Amer: >90 mL/min (ref >90)
GFR, EST NON AFRICAN AMERICAN: 81 mL/min — AB (ref >90)
Glucose, Bld: 70 mg/dL (ref 70–99)
POTASSIUM: 4 meq/L (ref 3.7–5.3)
SODIUM: 139 meq/L (ref 137–147)

## 2014-11-05 LAB — URINALYSIS, ROUTINE W REFLEX MICROSCOPIC
Glucose, UA: NEGATIVE mg/dL
Hgb urine dipstick: NEGATIVE
Ketones, ur: NEGATIVE mg/dL
LEUKOCYTES UA: NEGATIVE
Nitrite: NEGATIVE
PROTEIN: 100 mg/dL — AB
SPECIFIC GRAVITY, URINE: 1.038 — AB (ref 1.005–1.030)
UROBILINOGEN UA: 0.2 mg/dL (ref 0.0–1.0)
pH: 6 (ref 5.0–8.0)

## 2014-11-05 LAB — CBC WITH DIFFERENTIAL/PLATELET
Basophils Absolute: 0 10*3/uL (ref 0.0–0.1)
Basophils Relative: 0 % (ref 0–1)
EOS ABS: 0.3 10*3/uL (ref 0.0–0.7)
Eosinophils Relative: 3 % (ref 0–5)
HCT: 42.3 % (ref 36.0–46.0)
HEMOGLOBIN: 13.4 g/dL (ref 12.0–15.0)
LYMPHS PCT: 54 % — AB (ref 12–46)
Lymphs Abs: 5.8 10*3/uL — ABNORMAL HIGH (ref 0.7–4.0)
MCH: 27.1 pg (ref 26.0–34.0)
MCHC: 31.7 g/dL (ref 30.0–36.0)
MCV: 85.6 fL (ref 78.0–100.0)
MONOS PCT: 7 % (ref 3–12)
Monocytes Absolute: 0.8 10*3/uL (ref 0.1–1.0)
NEUTROS ABS: 3.8 10*3/uL (ref 1.7–7.7)
NEUTROS PCT: 36 % — AB (ref 43–77)
PLATELETS: 192 10*3/uL (ref 150–400)
RBC: 4.94 MIL/uL (ref 3.87–5.11)
RDW: 13.6 % (ref 11.5–15.5)
WBC: 10.7 10*3/uL — ABNORMAL HIGH (ref 4.0–10.5)

## 2014-11-05 LAB — URINE MICROSCOPIC-ADD ON

## 2014-11-05 MED ORDER — CLONIDINE HCL 0.1 MG PO TABS: ORAL_TABLET | ORAL | Status: DC

## 2014-11-05 MED ORDER — CLONIDINE HCL 0.1 MG PO TABS
0.2000 mg | ORAL_TABLET | Freq: Once | ORAL | Status: AC
  Administered 2014-11-05: 0.2 mg via ORAL
  Filled 2014-11-05: qty 2

## 2014-11-05 MED ORDER — ONDANSETRON HCL 4 MG PO TABS: 4.0000 mg | ORAL_TABLET | Freq: Four times a day (QID) | ORAL | Status: DC

## 2014-11-05 MED ORDER — ONDANSETRON 8 MG PO TBDP
8.0000 mg | ORAL_TABLET | Freq: Once | ORAL | Status: AC
  Administered 2014-11-05: 8 mg via ORAL
  Filled 2014-11-05: qty 1

## 2014-11-05 MED ORDER — LOPERAMIDE HCL 2 MG PO CAPS: 2.0000 mg | ORAL_CAPSULE | Freq: Four times a day (QID) | ORAL | Status: DC | PRN

## 2014-11-05 NOTE — ED Notes (Signed)
Pt requesting detox from percocet, Roxicodone, norco. Last use last night.
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
## 2014-11-05 NOTE — Discharge Instructions (Signed)
Opioid Withdrawal °Opioids are a group of narcotic drugs. They include the street drug heroin. They also include pain medicines, such as morphine, hydrocodone, oxycodone, and fentanyl. Opioid withdrawal is a group of characteristic physical and mental signs and symptoms. It typically occurs if you have been using opioids daily for several weeks or longer and stop using or rapidly decrease use. Opioid withdrawal can also occur if you have used opioids daily for a long time and are given a medicine to block the effect.  °SIGNS AND SYMPTOMS °Opioid withdrawal includes three or more of the following symptoms:  °· Depressed, anxious, or irritable mood. °· Nausea or vomiting. °· Muscle aches or spasms.   °· Watery eyes.    °· Runny nose. °· Dilated pupils, sweating, or hairs standing on end. °· Diarrhea or intestinal cramping. °· Yawning.   °· Fever. °· Increased blood pressure. °· Fast pulse. °· Restlessness or trouble sleeping. °These signs and symptoms occur within several hours of stopping or reducing short-acting opioids, such as heroin. They can occur within 3 days of stopping or reducing long-acting opioids, such as methadone. Withdrawal begins within minutes of receiving a drug that blocks the effects of opioids, such as naltrexone or naloxone. °DIAGNOSIS  °Opioid use disorder is diagnosed by your health care provider. You will be asked about your symptoms, drug and alcohol use, medical history, and use of medicines. A physical exam may be done. Lab tests may be ordered. Your health care provider may have you see a mental health professional.  °TREATMENT  °The treatment for opioid withdrawal is usually provided by medical doctors with special training in substance use disorders (addiction specialists). The following medicines may be included in treatment: °· Opioids given in place of the abused opioid. They turn on opioid receptors in the brain and lessen or prevent withdrawal symptoms. They are gradually  decreased (opioid substitution and taper). °· Non-opioids that can lessen certain opioid withdrawal symptoms. They may be used alone or with opioid substitution and taper. °Successful long-term recovery usually requires medicine, counseling, and group support. °HOME CARE INSTRUCTIONS  °· Take medicines only as directed by your health care provider. °· Check with your health care provider before starting new medicines. °· Keep all follow-up visits as directed by your health care provider. °SEEK MEDICAL CARE IF: °· You are not able to take your medicines as directed. °· Your symptoms get worse. °· You relapse. °SEEK IMMEDIATE MEDICAL CARE IF: °· You have serious thoughts about hurting yourself or others. °· You have a seizure. °· You lose consciousness. °Document Released: 11/17/2003 Document Revised: 03/31/2014 Document Reviewed: 11/27/2013 °ExitCare® Patient Information ©2015 [employer]. This information is not intended to replace advice given to you by your health care provider. Make sure you discuss any questions you have with your health care provider. ° ° °Opioid Use Disorder °Opioid use disorder is a mental disorder. It is the continued nonmedical use of opioids in spite of risks to health and well-being. Misused opioids include the street drug heroin. They also include pain medicines such as morphine, hydrocodone, oxycodone, and fentanyl. Opioids are very addictive. People who misuse opioids get an exaggerated feeling of well-being. Opioid use disorder often disrupts activities at home, work, or school. It may cause mental or physical problems.  °A family history of opioid use disorder puts you at higher risk of it. People with opioid use disorder often misuse other drugs or have mental illness such as depression, posttraumatic stress disorder, or antisocial personality disorder. They also are   at risk of suicide and death from overdose. °SIGNS AND SYMPTOMS  °Signs and symptoms of opioid use disorder  include: °· Use of opioids in larger amounts or over a longer period than intended. °· Unsuccessful attempts to cut down or control opioid use. °· A lot of time spent obtaining, using, or recovering from the effects of opioids. °· A strong desire or urge to use opioids (craving). °· Continued use of opioids in spite of major problems at work, school, or home because of use. °· Continued use of opioids in spite of relationship problems because of use. °· Giving up or cutting down on important life activities because of opioid use. °· Use of opioids over and over in situations when it is physically hazardous, such as driving a car. °· Continued use of opioids in spite of a physical problem that is likely related to use. Physical problems can include: °· Severe constipation. °· Poor nutrition. °· Infertility. °· Tuberculosis. °· Aspiration pneumonia. °· Infections such as human immunodeficiency virus (HIV) and hepatitis (from injecting opioids). °· Continued use of opioids in spite of a mental problem that is likely related to use. Mental problems can include: °· Depression. °· Anxiety. °· Hallucinations. °· Sleep problems. °· Loss of sexual function. °· Need to use more and more opioids to get the same effect, or lessened effect over time with use of the same amount (tolerance). °· Having withdrawal symptoms when opioid use is stopped, or using opioids to reduce or avoid withdrawal symptoms. Withdrawal symptoms include: °· Depressed, anxious, or irritable mood. °· Nausea, vomiting, diarrhea, or intestinal cramping. °· Muscle aches or spasms. °· Excessive tearing or runny nose. °· Dilated pupils, sweating, or hairs standing on end. °· Yawning. °· Fever, raised blood pressure, or fast pulse. °· Restlessness or trouble sleeping. This does not apply to people taking opioids for medical reasons only. °DIAGNOSIS °Opioid use disorder is diagnosed by your health care provider. You may be asked questions about your opioid use  and and how it affects your life. A physical exam may be done. A drug screen may be ordered. You may be referred to a mental health professional. The diagnosis of opioid use disorder requires at least two symptoms within 12 months. The type of opioid use disorder you have depends on the number of signs and symptoms you have. The type may be: °· Mild. Two or three signs and symptoms.    °· Moderate. Four or five signs and symptoms.   °· Severe. Six or more signs and symptoms. °TREATMENT  °Treatment is usually provided by mental health professionals with training in substance use disorders. The following options are available: °· Detoxification. This is the first step in treatment for withdrawal. It is medically supervised withdrawal with the use of medicines. These medicines lessen withdrawal symptoms. They also raise the chance of becoming opioid free. °· Counseling, also known as talk therapy. Talk therapy addresses the reasons you use opioids. It also addresses ways to keep you from using again (relapse). The goals of talk therapy are to avoid relapse by: °· Identifying and avoiding triggers for use. °· Finding healthy ways to cope with stress. °· Learning how to handle cravings. °· Support groups. Support groups provide emotional support, advice, and guidance. °· A medicine that blocks opioid receptors in your brain. This medicine can reduce opioid cravings that lead to relapse. This medicine also blocks the desired opioid effect when relapse occurs. °· Opioids that are taken by mouth in place of the misused opioid (opioid   maintenance treatment). These medicines satisfy cravings but are safer than commonly misused opioids. This often is the best option for people who continue to relapse with other treatments. °HOME CARE INSTRUCTIONS  °· Take medicines only as directed by your health care provider. °· Check with your health care provider before starting new medicines. °· Keep all follow-up visits as directed by  your health care provider. °SEEK MEDICAL CARE IF: °· You are not able to take your medicines as directed. °· Your symptoms get worse. °SEEK IMMEDIATE MEDICAL CARE IF: °· You have serious thoughts about hurting yourself or others. °· You may have taken an overdose of opioids. °FOR MORE INFORMATION °· National Institute on Drug Abuse: www.drugabuse.gov °· Substance Abuse and Mental Health Services Administration: www.samhsa.gov °Document Released: 09/11/2007 Document Revised: 03/31/2014 Document Reviewed: 11/27/2013 °ExitCare® Patient Information ©2015 [employer]. This information is not intended to replace advice given to you by your health care provider. Make sure you discuss any questions you have with your health care provider. ° °

## 2014-11-05 NOTE — ED Notes (Signed)
Pt in hallway yelling. Patient was asked to go back to her room and to lower her voice. Security called to bedside.

## 2014-11-19 ENCOUNTER — Encounter (HOSPITAL_COMMUNITY): Payer: Self-pay | Admitting: Emergency Medicine

## 2014-11-19 ENCOUNTER — Emergency Department (HOSPITAL_COMMUNITY)
Admission: EM | Admit: 2014-11-19 | Discharge: 2014-11-19 | Disposition: A | Discharge status: Home / Self Care | Payer: Medicaid Other | Attending: Emergency Medicine | Admitting: Emergency Medicine

## 2014-11-19 ENCOUNTER — Emergency Department (HOSPITAL_COMMUNITY): Payer: Medicaid Other

## 2014-11-19 DIAGNOSIS — Z720 Tobacco use: Secondary | ICD-10-CM | POA: Insufficient documentation

## 2014-11-19 DIAGNOSIS — R51 Headache: Secondary | ICD-10-CM | POA: Diagnosis not present

## 2014-11-19 DIAGNOSIS — F419 Anxiety disorder, unspecified: Secondary | ICD-10-CM | POA: Diagnosis not present

## 2014-11-19 DIAGNOSIS — Z8742 Personal history of other diseases of the female genital tract: Secondary | ICD-10-CM | POA: Insufficient documentation

## 2014-11-19 DIAGNOSIS — Z862 Personal history of diseases of the blood and blood-forming organs and certain disorders involving the immune mechanism: Secondary | ICD-10-CM | POA: Diagnosis not present

## 2014-11-19 DIAGNOSIS — K047 Periapical abscess without sinus: Secondary | ICD-10-CM | POA: Insufficient documentation

## 2014-11-19 DIAGNOSIS — R519 Headache, unspecified: Secondary | ICD-10-CM

## 2014-11-19 DIAGNOSIS — Z8619 Personal history of other infectious and parasitic diseases: Secondary | ICD-10-CM | POA: Diagnosis not present

## 2014-11-19 DIAGNOSIS — Z79899 Other long term (current) drug therapy: Secondary | ICD-10-CM | POA: Diagnosis not present

## 2014-11-19 DIAGNOSIS — F909 Attention-deficit hyperactivity disorder, unspecified type: Secondary | ICD-10-CM | POA: Diagnosis not present

## 2014-11-19 DIAGNOSIS — Z880 Allergy status to penicillin: Secondary | ICD-10-CM | POA: Insufficient documentation

## 2014-11-19 DIAGNOSIS — K088 Other specified disorders of teeth and supporting structures: Secondary | ICD-10-CM | POA: Diagnosis present

## 2014-11-19 MED ORDER — HYDROMORPHONE HCL 1 MG/ML IJ SOLN
1.0000 mg | Freq: Once | INTRAMUSCULAR | Status: AC
Start: 2014-11-19 — End: 2014-11-19
  Administered 2014-11-19: 1 mg via INTRAMUSCULAR
  Filled 2014-11-19: qty 1

## 2014-11-19 MED ORDER — ONDANSETRON 8 MG PO TBDP
8.0000 mg | ORAL_TABLET | Freq: Once | ORAL | Status: AC
  Administered 2014-11-19: 8 mg via ORAL
  Filled 2014-11-19: qty 1

## 2014-11-19 NOTE — ED Notes (Signed)
MD at bedside. 

## 2014-11-19 NOTE — ED Notes (Signed)
Requesting pain medicine.

## 2014-11-19 NOTE — Discharge Instructions (Signed)
Abscessed Tooth An abscessed tooth is an infection around your tooth. It may be caused by holes or damage to the tooth (cavity) or a dental disease. An abscessed tooth causes mild to very bad pain in and around the tooth. See your dentist right away if you have tooth or gum pain. HOME CARE  Take your medicine as told. Finish it even if you start to feel better.  Do not drive after taking pain medicine.  Rinse your mouth (gargle) often with salt water ( teaspoon salt in 8 ounces of warm water).  Do not apply heat to the outside of your face. GET HELP RIGHT AWAY IF:   You have a temperature by mouth above 102 F (38.9 C), not controlled by medicine.  You have chills and a very bad headache.  You have problems breathing or swallowing.  Your mouth will not open.  You develop puffiness (swelling) on the neck or around the eye.  Your pain is not helped by medicine.  Your pain is getting worse instead of better. MAKE SURE YOU:   Understand these instructions.  Will watch your condition.  Will get help right away if you are not doing well or get worse. Document Released: 05/02/2008 Document Revised: 02/06/2012 Document Reviewed: 02/22/2011 Exit[employer] Patient Information 2015 [employer]. This information is not intended to replace advice given to you by your health care provider. Make sure you discuss any questions you have with your health care provider.  

## 2014-11-19 NOTE — ED Notes (Addendum)
Dr. Blinda LeatherwoodPollina notified of pt's request for pain medication.

## 2014-11-19 NOTE — ED Notes (Signed)
Patient states that she was seen today for recurrent dental abcesses and was told to come to ER for CT scan because she has had dizziness, blurred vision, vomiting and increased pain. Alert and oriented.
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
## 2014-12-11 ENCOUNTER — Ambulatory Visit (INDEPENDENT_AMBULATORY_CARE_PROVIDER_SITE_OTHER): Payer: Medicaid Other | Admitting: Family Medicine

## 2014-12-11 ENCOUNTER — Encounter: Payer: Self-pay | Admitting: Family Medicine

## 2014-12-11 VITALS — BP 119/76 | HR 84 | Temp 97.1°F | Ht 61.0 in | Wt 112.0 lb

## 2014-12-11 DIAGNOSIS — R300 Dysuria: Secondary | ICD-10-CM

## 2014-12-11 DIAGNOSIS — K047 Periapical abscess without sinus: Secondary | ICD-10-CM

## 2014-12-11 DIAGNOSIS — F411 Generalized anxiety disorder: Secondary | ICD-10-CM

## 2014-12-11 DIAGNOSIS — L0291 Cutaneous abscess, unspecified: Secondary | ICD-10-CM

## 2014-12-11 LAB — POCT UA - MICROSCOPIC ONLY
Bacteria, U Microscopic: NEGATIVE
CASTS, UR, LPF, POC: NEGATIVE
Crystals, Ur, HPF, POC: NEGATIVE
Mucus, UA: NEGATIVE
RBC, urine, microscopic: NEGATIVE
Yeast, UA: NEGATIVE

## 2014-12-11 LAB — POCT URINALYSIS DIPSTICK
Bilirubin, UA: NEGATIVE
Blood, UA: NEGATIVE
Glucose, UA: NEGATIVE
Ketones, UA: NEGATIVE
NITRITE UA: NEGATIVE
PH UA: 5
Protein, UA: NEGATIVE
Spec Grav, UA: 1.015
Urobilinogen, UA: NEGATIVE

## 2014-12-11 MED ORDER — ALPRAZOLAM 0.5 MG PO TBDP: 0.5000 mg | ORAL_TABLET | Freq: Two times a day (BID) | ORAL | Status: DC | PRN

## 2014-12-11 MED ORDER — CLINDAMYCIN HCL 300 MG PO CAPS: 300.0000 mg | ORAL_CAPSULE | Freq: Three times a day (TID) | ORAL | Status: DC

## 2014-12-11 MED ORDER — OXYCODONE HCL 5 MG PO TABS: 5.0000 mg | ORAL_TABLET | ORAL | Status: DC | PRN

## 2014-12-11 NOTE — Progress Notes (Signed)
   Subjective:    Patient ID: Katelyn [name], female    DOB: [birth year], 29 y.o.   MRN: [id number]  HPI  Patient Active Problem List   Diagnosis Date Noted  . Menopausal syndrome (hot flashes) 02/21/2013  . Situational anxiety 04/05/2012  . Vaginal Discharge 04/05/2012  . CIN I (cervical intraepithelial neoplasia I) 10/05/2011  . Pelvic pain in female 10/05/2011  . Bartholin's cyst 09/05/2011   Outpatient Encounter Prescriptions as of 12/11/2014  Medication Sig  . oxyCODONE (OXY IR/ROXICODONE) 5 MG immediate release tablet Take 5 mg by mouth every 4 (four) hours as needed for severe pain.  Marland Kitchen. penicillin v potassium (VEETID) 500 MG tablet   . [DISCONTINUED] cloNIDine (CATAPRES) 0.1 MG tablet 1 tab po tid x 2 days, then bid x 2 days, then once daily x 2 days (Patient not taking: Reported on 11/19/2014)  . [DISCONTINUED] loperamide (IMODIUM) 2 MG capsule Take 1 capsule (2 mg total) by mouth 4 (four) times daily as needed for diarrhea or loose stools. (Patient not taking: Reported on 11/19/2014)  . [DISCONTINUED] ondansetron (ZOFRAN) 4 MG tablet Take 1 tablet (4 mg total) by mouth every 6 (six) hours. (Patient not taking: Reported on 11/19/2014)  . [DISCONTINUED] phenazopyridine (PYRIDIUM) 200 MG tablet Take 1 tablet (200 mg total) by mouth 3 (three) times daily as needed for pain. (Patient not taking: Reported on 11/19/2014)   Review of Systems     Objective:   Physical Exam  BP 119/76 mmHg  Pulse 84  Temp(Src) 97.1 F (36.2 C) (Oral)  Ht 5\' 1"  (1.549 m)  Wt 112 lb (50.803 kg)  BMI 21.17 kg/m2  LMP 10/28/2011       Assessment & Plan:

## 2014-12-11 NOTE — Progress Notes (Signed)
   Subjective:    Patient ID: Katelyn [name], female    DOB: [birth year], 29 y.o.   MRN: [id number]  HPI 29 year old female with her first visit here. She has a mouth full of carious and decayed teeth apparently has an abscess and is on penicillin. She does have an appointment with oral surgeon, by history, to have 4 teeth extracted home 118. She feels like the penicillin is making her sick. In addition there is a small abscess at the temporal corner of her right eye. There has been some purulent drainage. She also is having some low back pain and urinary dysuria and frequency.  Under summary sheet there is mention of opioid abuse and detoxification and ask her about this she rationalized this in a reasonable way so I am willing to give her the benefit of doubt until she proves that she does not take medicines or prescriptions as prescribed. And I have explained this to her in detail    Review of Systems  Constitutional: Negative.   HENT: Positive for dental problem.   Genitourinary: Positive for dysuria.  Psychiatric/Behavioral: The patient is nervous/anxious.        Objective:   Physical Exam  HENT:  Mouth/Throat: Oropharynx is clear and moist.  Multiple teeth are decayed and carious and tender to percussion with tongue blade  Skin:  Circular abscess at temporal corner of right eye this is tender to touch but I cannot express any purulent material. There is no surrounding erythematous consistent with cellulitis          Assessment & Plan:  1. Dysuria Culture urine - POCT urinalysis dipstick - POCT UA - Microscopic Only 1. Dysuria  - POCT urinalysis dipstick - POCT UA - Microscopic Only  2. Tooth abscess DC penicillin and favor of clindamycin 300 mg twice a day  3. Soft tissue abscess Same as above  4. Anxiety state She associates anxiety with her teeth pain so will cover with alprazolam 0.5 mg #20 pending r  Frederica KusterStephen M Miller MDesolution of dental abscesses

## 2014-12-13 LAB — URINE CULTURE

## 2014-12-29 ENCOUNTER — Telehealth: Payer: Self-pay | Admitting: Physical Therapy

## 2014-12-29 NOTE — Telephone Encounter (Signed)
This was sent to my attn but I do not know this patient.

## 2015-01-01 ENCOUNTER — Ambulatory Visit (INDEPENDENT_AMBULATORY_CARE_PROVIDER_SITE_OTHER): Payer: Medicaid Other | Admitting: Family Medicine

## 2015-01-01 ENCOUNTER — Encounter: Payer: Self-pay | Admitting: Family Medicine

## 2015-01-01 VITALS — BP 112/78 | HR 94 | Temp 97.1°F | Ht 61.0 in | Wt 116.6 lb

## 2015-01-01 DIAGNOSIS — K047 Periapical abscess without sinus: Secondary | ICD-10-CM

## 2015-01-01 MED ORDER — KETOROLAC TROMETHAMINE 30 MG/ML IJ SOLN: 30.0000 mg | Freq: Once | INTRAMUSCULAR | Status: AC

## 2015-01-01 MED ORDER — KETOROLAC TROMETHAMINE 30 MG/ML IJ SOLN
30.0000 mg | Freq: Once | INTRAMUSCULAR | Status: AC
  Administered 2015-01-01: 30 mg via INTRAMUSCULAR

## 2015-01-01 MED ORDER — NAPROXEN 500 MG PO TABS: 500.0000 mg | ORAL_TABLET | Freq: Two times a day (BID) | ORAL | Status: DC

## 2015-01-01 NOTE — Progress Notes (Signed)
   Subjective:    Patient ID: Katelyn [name], female    DOB: January 05, 1986, 29 y.o.   MRN: [id number]  HPI Patient is here for c/o tooth abscess.  She has been to see her dentist who rx'd her abx's and pain medicine tramadol.  She states the pain meds are not working and she requests oxycodone.  Review of  CRS shows she has had 4 pain providers in the last 6 months.  She was rx'd oxycodone 12/11/14 for another pain problem.  Review of Systems  Constitutional: Negative for fever.  HENT: Negative for ear pain.   Eyes: Negative for discharge.  Respiratory: Negative for cough.   Cardiovascular: Negative for chest pain.  Gastrointestinal: Negative for abdominal distention.  Endocrine: Negative for polyuria.  Genitourinary: Negative for difficulty urinating.  Musculoskeletal: Negative for gait problem and neck pain.  Skin: Negative for color change and rash.  Neurological: Negative for speech difficulty and headaches.  Psychiatric/Behavioral: Negative for agitation.       Objective:    BP 112/78 mmHg  Pulse 94  Temp(Src) 97.1 F (36.2 C) (Oral)  Ht 5\' 1"  (1.549 m)  Wt 116 lb 9.6 oz (52.889 kg)  BMI 22.04 kg/m2  LMP 10/28/2011 Physical Exam  Constitutional: She is oriented to person, place, and time. She appears well-developed and well-nourished.  HENT:  Head: Normocephalic and atraumatic.  Mouth/Throat: Oropharynx is clear and moist.  Eyes: Pupils are equal, round, and reactive to light.  Neck: Normal range of motion. Neck supple.  Cardiovascular: Normal rate and regular rhythm.   No murmur heard. Pulmonary/Chest: Effort normal and breath sounds normal.  Abdominal: Soft. Bowel sounds are normal. There is no tenderness.  Neurological: She is alert and oriented to person, place, and time.  Skin: Skin is warm and dry.  Psychiatric: She has a normal mood and affect.          Assessment & Plan:     ICD-9-CM ICD-10-CM   1. Tooth abscess 522.5 K04.7 ketorolac (TORADOL) 30  MG/ML injection 30 mg     ketorolac (TORADOL) 30 MG/ML injection 30 mg     ketorolac (TORADOL) 30 MG/ML injection 30 mg     naproxen (NAPROSYN) 500 MG tablet   Follow up with dentist for pain  No Follow-up on file.  Deatra CanterWilliam J Mozella Rexrode FNP

## 2015-03-03 ENCOUNTER — Telehealth: Payer: Self-pay | Admitting: Family Medicine

## 2015-03-03 NOTE — Telephone Encounter (Signed)
appt scheduled for 4/6 @ 10 with Tiffany

## 2015-03-04 ENCOUNTER — Ambulatory Visit: Payer: Medicaid Other | Admitting: Physician Assistant

## 2015-04-23 IMAGING — CT CT ABD-PELV W/ CM
2 of 4 series · 17 of 46 positions shown, 19 images · IV contrast (APPLIED)
Comparison: CT abdomen and pelvis June 07, 2010

CLINICAL DATA: Abdominal pain with nausea vomiting

CT ABDOMEN AND PELVIS WITH CONTRAST
TECHNIQUE: Multidetector CT imaging of the abdomen and pelvis was
performed following the standard protocol during bolus
administration of intravenous contrast. Oral contrast was also
administered.
Contrast: 80mL OMNIPAQUE IOHEXOL 300 MG/ML  SOLN

[Series 2: abd/ pelvis 5.0 i30f 1 · axial · 0.66mm/px · z∈[+687,+1017]mm · 14 of 74 slices shown, 16 images]
[im 4/74  soft-tissue]
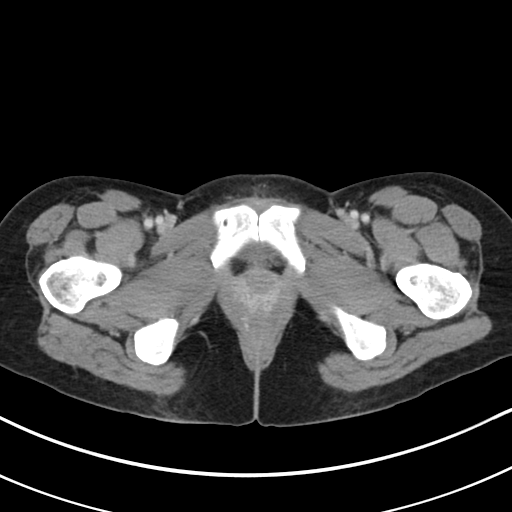
[im 4/74  bone]
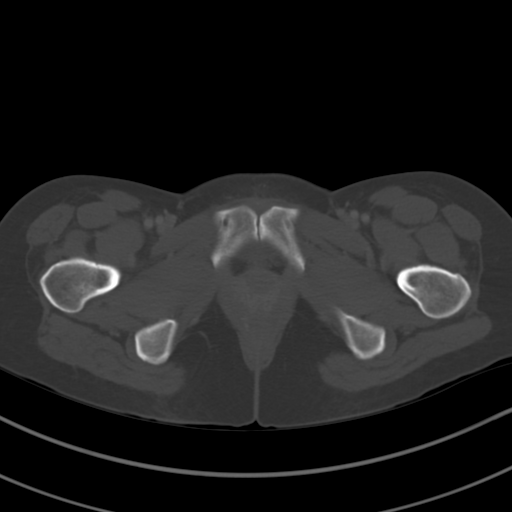
[im 10/74  soft-tissue]
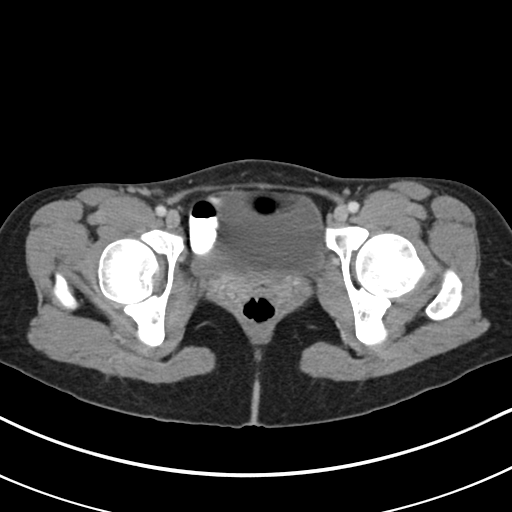
[im 16/74  soft-tissue]
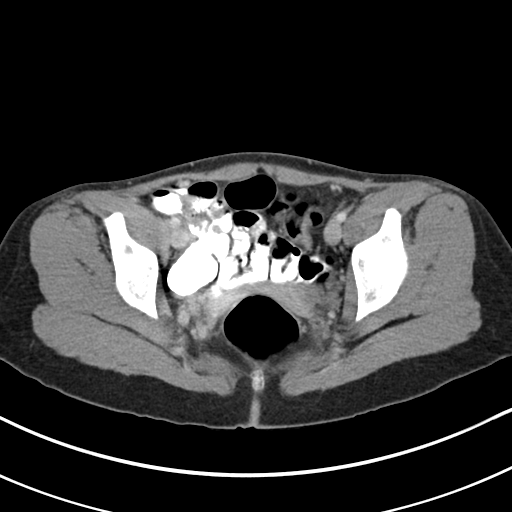
[im 19/74  soft-tissue]
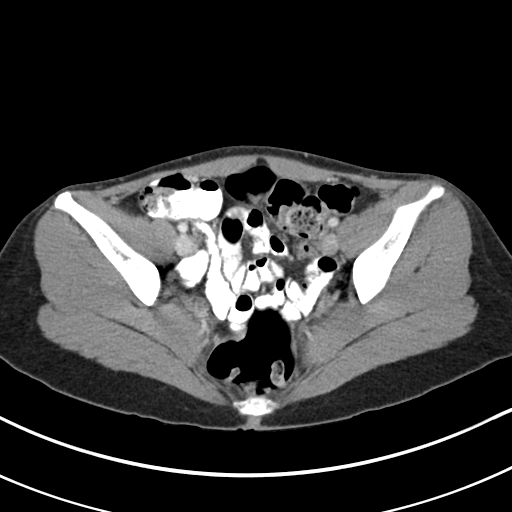
[im 25/74  soft-tissue]
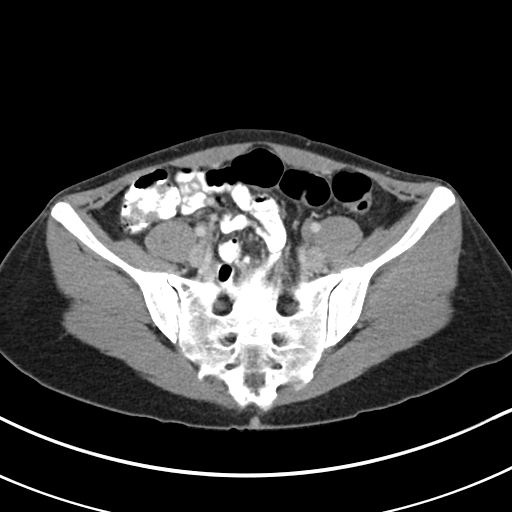
[im 31/74  soft-tissue]
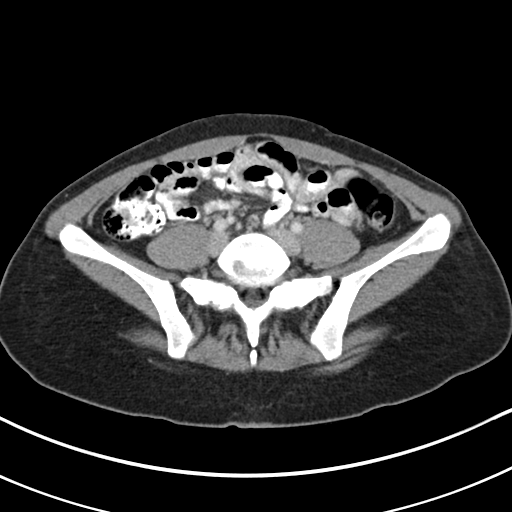
[im 34/74  soft-tissue]
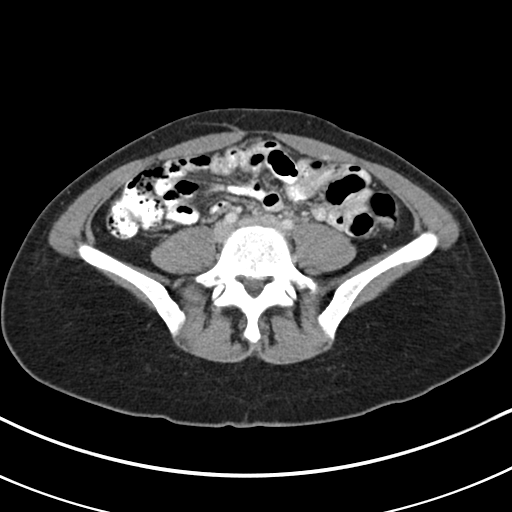
[im 40/74  soft-tissue]
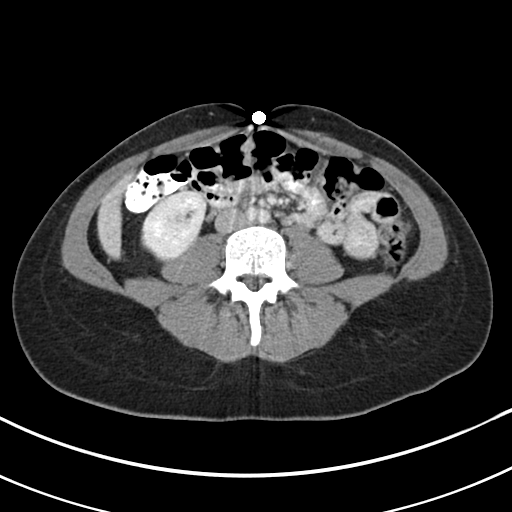
[im 43/74  soft-tissue]
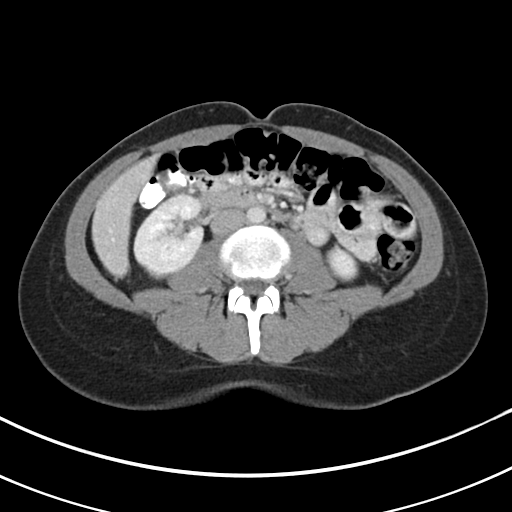
[im 43/74  bone]
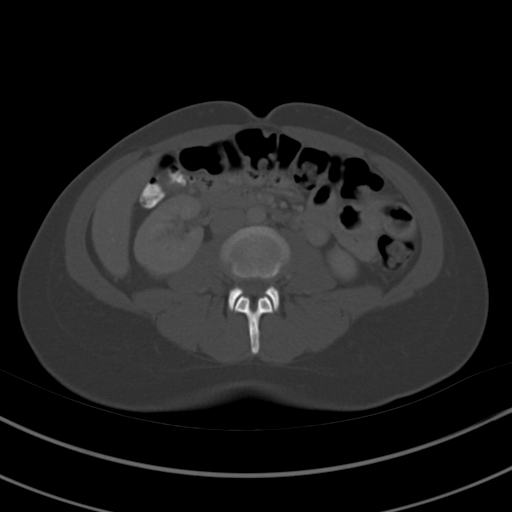
[im 49/74  soft-tissue]
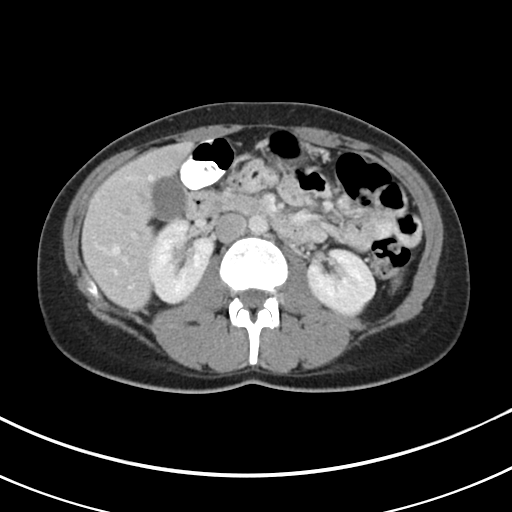
[im 55/74  soft-tissue]
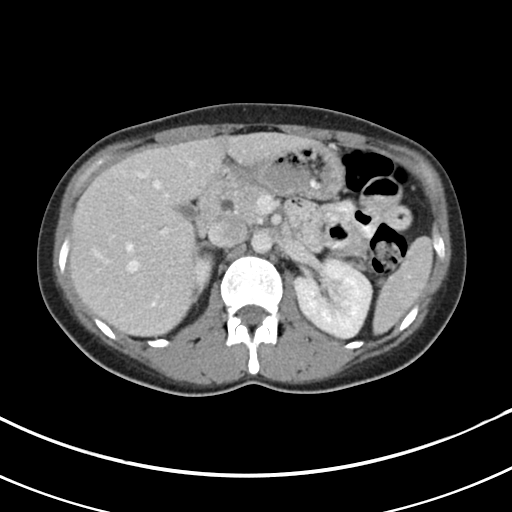
[im 58/74  soft-tissue]
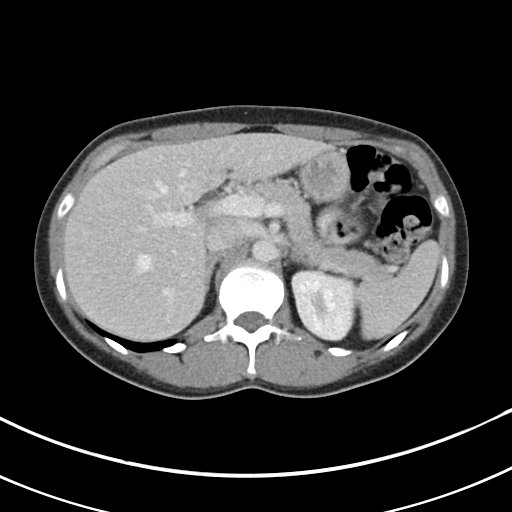
[im 64/74  soft-tissue]
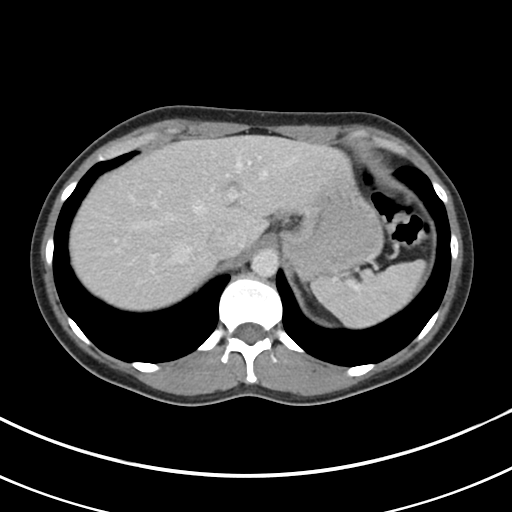
[im 70/74  soft-tissue]
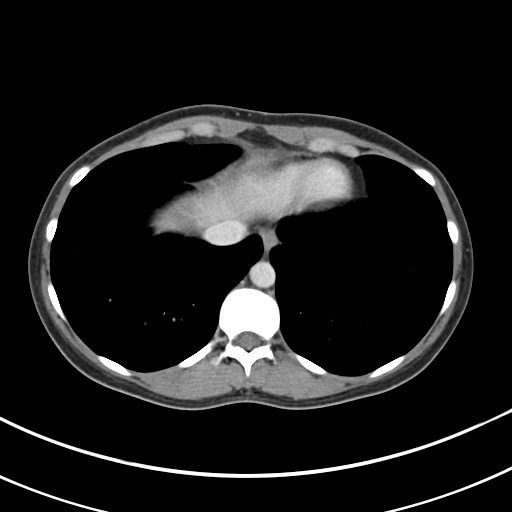

[Series 5: cororal soft tissue · coronal · 0.85mm/px · 3 of 99 slices shown]
[im 33/99  soft-tissue]
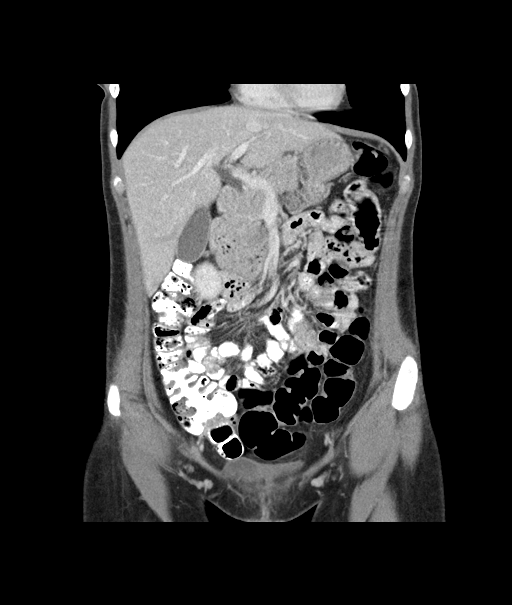
[im 44/99  soft-tissue]
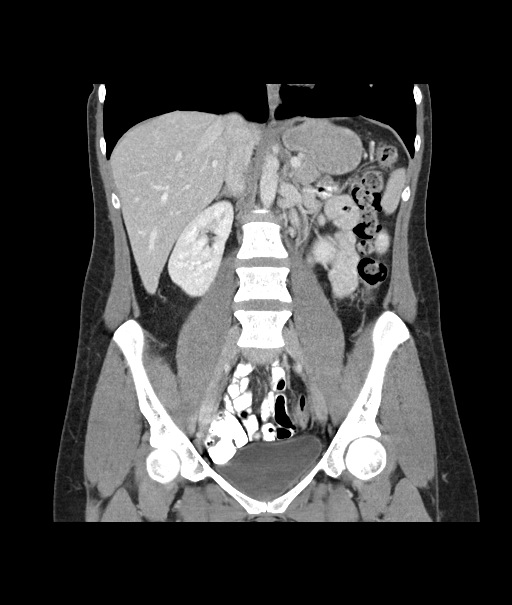
[im 55/99  soft-tissue]
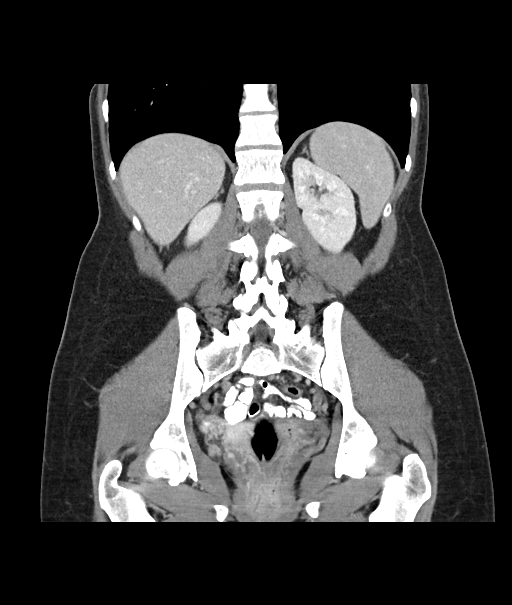

[17 of 46 positions shown; findings below may reference images not displayed]

FINDINGS: Lung bases are clear.

There is a probable hemangioma in the anterior, inferior aspect of
the right lobe of the liver measuring 1.2 x 0.7 cm, stable from
prior study.  No other focal liver lesions are identified.  There
is no biliary duct dilatation.

Spleen, pancreas, and adrenals appear normal.  Kidneys bilaterally
appear normal without mass or hydronephrosis on either side.

In the pelvis, the uterus is absent.  There is no pelvic mass or
fluid collection.  Appendix appears normal.

There is no bowel obstruction.  There is no free air or portal
venous air.

There is no ascites, adenopathy, or abscess in the abdomen or
pelvis.  Aorta is nonaneurysmal.  There is no blastic or lytic bone
lesions.
IMPRESSION: Small apparent hemangioma in liver.  Uterus absent.  There is no
mesenteric inflammation or abscess.  No bowel obstruction.
Appendix appears normal.

## 2015-07-18 ENCOUNTER — Emergency Department (HOSPITAL_COMMUNITY)
Admission: EM | Admit: 2015-07-18 | Discharge: 2015-07-18 | Disposition: A | Discharge status: Home / Self Care | Payer: Medicaid Other | Attending: Emergency Medicine | Admitting: Emergency Medicine

## 2015-07-18 ENCOUNTER — Emergency Department (HOSPITAL_COMMUNITY): Payer: Medicaid Other

## 2015-07-18 ENCOUNTER — Encounter (HOSPITAL_COMMUNITY): Payer: Self-pay | Admitting: Emergency Medicine

## 2015-07-18 DIAGNOSIS — N390 Urinary tract infection, site not specified: Secondary | ICD-10-CM | POA: Diagnosis not present

## 2015-07-18 DIAGNOSIS — Z792 Long term (current) use of antibiotics: Secondary | ICD-10-CM | POA: Diagnosis not present

## 2015-07-18 DIAGNOSIS — Z8719 Personal history of other diseases of the digestive system: Secondary | ICD-10-CM | POA: Insufficient documentation

## 2015-07-18 DIAGNOSIS — R55 Syncope and collapse: Secondary | ICD-10-CM | POA: Insufficient documentation

## 2015-07-18 DIAGNOSIS — Z791 Long term (current) use of non-steroidal anti-inflammatories (NSAID): Secondary | ICD-10-CM | POA: Diagnosis not present

## 2015-07-18 DIAGNOSIS — Z8659 Personal history of other mental and behavioral disorders: Secondary | ICD-10-CM | POA: Insufficient documentation

## 2015-07-18 DIAGNOSIS — Z8619 Personal history of other infectious and parasitic diseases: Secondary | ICD-10-CM | POA: Diagnosis not present

## 2015-07-18 DIAGNOSIS — Z720 Tobacco use: Secondary | ICD-10-CM | POA: Diagnosis not present

## 2015-07-18 DIAGNOSIS — Z880 Allergy status to penicillin: Secondary | ICD-10-CM | POA: Insufficient documentation

## 2015-07-18 DIAGNOSIS — Z862 Personal history of diseases of the blood and blood-forming organs and certain disorders involving the immune mechanism: Secondary | ICD-10-CM | POA: Diagnosis not present

## 2015-07-18 LAB — URINALYSIS, ROUTINE W REFLEX MICROSCOPIC
BILIRUBIN URINE: NEGATIVE
GLUCOSE, UA: NEGATIVE mg/dL
HGB URINE DIPSTICK: NEGATIVE
KETONES UR: NEGATIVE mg/dL
NITRITE: POSITIVE — AB
PH: 6.5 (ref 5.0–8.0)
Protein, ur: NEGATIVE mg/dL
SPECIFIC GRAVITY, URINE: 1.014 (ref 1.005–1.030)
Urobilinogen, UA: 0.2 mg/dL (ref 0.0–1.0)

## 2015-07-18 LAB — CBG MONITORING, ED: Glucose-Capillary: 67 mg/dL (ref 65–99)

## 2015-07-18 LAB — CBC
HCT: 39.9 % (ref 36.0–46.0)
Hemoglobin: 12.4 g/dL (ref 12.0–15.0)
MCH: 26.9 pg (ref 26.0–34.0)
MCHC: 31.1 g/dL (ref 30.0–36.0)
MCV: 86.6 fL (ref 78.0–100.0)
PLATELETS: 167 10*3/uL (ref 150–400)
RBC: 4.61 MIL/uL (ref 3.87–5.11)
RDW: 14 % (ref 11.5–15.5)
WBC: 10.6 10*3/uL — AB (ref 4.0–10.5)

## 2015-07-18 LAB — BASIC METABOLIC PANEL
Anion gap: 6 (ref 5–15)
BUN: 13 mg/dL (ref 6–20)
CHLORIDE: 105 mmol/L (ref 101–111)
CO2: 30 mmol/L (ref 22–32)
Calcium: 9.4 mg/dL (ref 8.9–10.3)
Creatinine, Ser: 0.63 mg/dL (ref 0.44–1.00)
GFR calc Af Amer: >60 mL/min (ref >60)
GFR calc non Af Amer: >60 mL/min (ref >60)
GLUCOSE: 75 mg/dL (ref 65–99)
Potassium: 4 mmol/L (ref 3.5–5.1)
Sodium: 141 mmol/L (ref 135–145)

## 2015-07-18 LAB — URINE MICROSCOPIC-ADD ON

## 2015-07-18 MED ORDER — KETOROLAC TROMETHAMINE 60 MG/2ML IM SOLN
60.0000 mg | Freq: Once | INTRAMUSCULAR | Status: AC
  Administered 2015-07-18: 60 mg via INTRAMUSCULAR
  Filled 2015-07-18: qty 2

## 2015-07-18 MED ORDER — SULFAMETHOXAZOLE-TRIMETHOPRIM 800-160 MG PO TABS: 1.0000 | ORAL_TABLET | Freq: Two times a day (BID) | ORAL | Status: AC

## 2015-07-18 NOTE — ED Notes (Signed)
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 No Hx of seizures. Per patient her doctors have told her she "passes out". Pt under stress due to passing of fiance'.  Upon patients arrival sister runs inside and yells to this RN " My sister is not breathing she's been having seizures!". Upon this RN's arrival to vehicle patient is sitting up smoking a cigarette. Pt alert and oriented. Pt did not lose bowel or bladder control during episode.

## 2015-07-18 NOTE — ED Notes (Signed)
Pt used call bell to call this writer to room to clean up iv supplies on table because it was "wigging her out"  She also wanted something for her head pain,  I told her I would get some tylenol,  She said "hell I could get that out of my purse , I want something I can't get out of my purse"  Pt also requested a liter of Normal saline,  Once again advised this was not ordered , she said she would tell the doctor she needed it .

## 2015-07-18 NOTE — ED Notes (Signed)
Pt returned from CT °

## 2015-07-18 NOTE — Discharge Instructions (Signed)
Syncope °Syncope is a medical term for fainting or passing out. This means you lose consciousness and drop to the ground. People are generally unconscious for less than 5 minutes. You may have some muscle twitches for up to 15 seconds before waking up and returning to normal. Syncope occurs more often in older adults, but it can happen to anyone. While most causes of syncope are not dangerous, syncope can be a sign of a serious medical problem. It is important to seek medical care.  °CAUSES  °Syncope is caused by a sudden drop in blood flow to the brain. The specific cause is often not determined. Factors that can bring on syncope include: °· Taking medicines that lower blood pressure. °· Sudden changes in posture, such as standing up quickly. °· Taking more medicine than prescribed. °· Standing in one place for too long. °· Seizure disorders. °· Dehydration and excessive exposure to heat. °· Low blood sugar (hypoglycemia). °· Straining to have a bowel movement. °· Heart disease, irregular heartbeat, or other circulatory problems. °· Fear, emotional distress, seeing blood, or severe pain. °SYMPTOMS  °Right before fainting, you may: °· Feel dizzy or light-headed. °· Feel nauseous. °· See all white or all black in your field of vision. °· Have cold, clammy skin. °DIAGNOSIS  °Your health care provider will ask about your symptoms, perform a physical exam, and perform an electrocardiogram (ECG) to record the electrical activity of your heart. Your health care provider may also perform other heart or blood tests to determine the cause of your syncope which may include: °· Transthoracic echocardiogram (TTE). During echocardiography, sound waves are used to evaluate how blood flows through your heart. °· Transesophageal echocardiogram (TEE). °· Cardiac monitoring. This allows your health care provider to monitor your heart rate and rhythm in real time. °· Holter monitor. This is a portable device that records your  heartbeat and can help diagnose heart arrhythmias. It allows your health care provider to track your heart activity for several days, if needed. °· Stress tests by exercise or by giving medicine that makes the heart beat faster. °TREATMENT  °In most cases, no treatment is needed. Depending on the cause of your syncope, your health care provider may recommend changing or stopping some of your medicines. °HOME CARE INSTRUCTIONS °· Have someone stay with you until you feel stable. °· Do not drive, use machinery, or play sports until your health care provider says it is okay. °· Keep all follow-up appointments as directed by your health care provider. °· Lie down right away if you start feeling like you might faint. Breathe deeply and steadily. Wait until all the symptoms have passed. °· Drink enough fluids to keep your urine clear or pale yellow. °· If you are taking blood pressure or heart medicine, get up slowly and take several minutes to sit and then stand. This can reduce dizziness. °SEEK IMMEDIATE MEDICAL CARE IF:  °· You have a severe headache. °· You have unusual pain in the chest, abdomen, or back. °· You are bleeding from your mouth or rectum, or you have black or tarry stool. °· You have an irregular or very fast heartbeat. °· You have pain with breathing. °· You have repeated fainting or seizure-like jerking during an episode. °· You faint when sitting or lying down. °· You have confusion. °· You have trouble walking. °· You have severe weakness. °· You have vision problems. °If you fainted, call your local emergency services (911 in [place] Do not drive   yourself to the hospital.  °MAKE SURE YOU: °· Understand these instructions. °· Will watch your condition. °· Will get help right away if you are not doing well or get worse. °Document Released: 11/14/2005 Document Revised: 11/19/2013 Document Reviewed: 01/13/2012 °ExitCare® Patient Information ©2015 [employer]. This information is not intended to replace  advice given to you by your health care provider. Make sure you discuss any questions you have with your health care provider. ° °Urinary Tract Infection °Urinary tract infections (UTIs) can develop anywhere along your urinary tract. Your urinary tract is your body's drainage system for removing wastes and extra water. Your urinary tract includes two kidneys, two ureters, a bladder, and a urethra. Your kidneys are a pair of bean-shaped organs. Each kidney is about the size of your fist. They are located below your ribs, one on each side of your spine. °CAUSES °Infections are caused by microbes, which are microscopic organisms, including fungi, viruses, and bacteria. These organisms are so small that they can only be seen through a microscope. Bacteria are the microbes that most commonly cause UTIs. °SYMPTOMS  °Symptoms of UTIs may vary by age and gender of the patient and by the location of the infection. Symptoms in young women typically include a frequent and intense urge to urinate and a painful, burning feeling in the bladder or urethra during urination. Older women and men are more likely to be tired, shaky, and weak and have muscle aches and abdominal pain. A fever may mean the infection is in your kidneys. Other symptoms of a kidney infection include pain in your back or sides below the ribs, nausea, and vomiting. °DIAGNOSIS °To diagnose a UTI, your caregiver will ask you about your symptoms. Your caregiver also will ask to provide a urine sample. The urine sample will be tested for bacteria and white blood cells. White blood cells are made by your body to help fight infection. °TREATMENT  °Typically, UTIs can be treated with medication. Because most UTIs are caused by a bacterial infection, they usually can be treated with the use of antibiotics. The choice of antibiotic and length of treatment depend on your symptoms and the type of bacteria causing your infection. °HOME CARE INSTRUCTIONS °· If you were  prescribed antibiotics, take them exactly as your caregiver instructs you. Finish the medication even if you feel better after you have only taken some of the medication. °· Drink enough water and fluids to keep your urine clear or pale yellow. °· Avoid caffeine, tea, and carbonated beverages. They tend to irritate your bladder. °· Empty your bladder often. Avoid holding urine for long periods of time. °· Empty your bladder before and after sexual intercourse. °· After a bowel movement, women should cleanse from front to back. Use each tissue only once. °SEEK MEDICAL CARE IF:  °· You have back pain. °· You develop a fever. °· Your symptoms do not begin to resolve within 3 days. °SEEK IMMEDIATE MEDICAL CARE IF:  °· You have severe back pain or lower abdominal pain. °· You develop chills. °· You have nausea or vomiting. °· You have continued burning or discomfort with urination. °MAKE SURE YOU:  °· Understand these instructions. °· Will watch your condition. °· Will get help right away if you are not doing well or get worse. °Document Released: 08/24/2005 Document Revised: 05/15/2012 Document Reviewed: 12/23/2011 °ExitCare® Patient Information ©2015 [employer]. This information is not intended to replace advice given to you by your health care provider. Make sure   you discuss any questions you have with your health care provider. ° °

## 2015-07-18 NOTE — ED Notes (Signed)
Nurse getting labs 
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
## 2015-07-18 NOTE — ED Notes (Signed)
Pt refused ending vital signs,  Pt said she was ready to "bolt"

## 2015-07-18 NOTE — ED Notes (Signed)
Patient transported to CT 

## 2015-07-29 ENCOUNTER — Ambulatory Visit: Payer: Medicaid Other | Admitting: Physician Assistant

## 2015-07-29 ENCOUNTER — Encounter: Payer: Self-pay | Admitting: Physician Assistant

## 2015-07-29 ENCOUNTER — Ambulatory Visit (INDEPENDENT_AMBULATORY_CARE_PROVIDER_SITE_OTHER): Payer: Medicaid Other | Admitting: Physician Assistant

## 2015-07-29 VITALS — BP 111/73 | HR 91 | Temp 98.2°F | Ht 61.0 in | Wt 106.0 lb

## 2015-07-29 DIAGNOSIS — F411 Generalized anxiety disorder: Secondary | ICD-10-CM

## 2015-07-29 DIAGNOSIS — L299 Pruritus, unspecified: Secondary | ICD-10-CM | POA: Diagnosis not present

## 2015-07-29 DIAGNOSIS — R109 Unspecified abdominal pain: Secondary | ICD-10-CM | POA: Diagnosis not present

## 2015-07-29 DIAGNOSIS — R399 Unspecified symptoms and signs involving the genitourinary system: Secondary | ICD-10-CM | POA: Diagnosis not present

## 2015-07-29 DIAGNOSIS — N3090 Cystitis, unspecified without hematuria: Secondary | ICD-10-CM

## 2015-07-29 LAB — POCT UA - MICROSCOPIC ONLY
Casts, Ur, LPF, POC: NEGATIVE
Crystals, Ur, HPF, POC: NEGATIVE
Yeast, UA: NEGATIVE

## 2015-07-29 LAB — POCT URINALYSIS DIPSTICK

## 2015-07-29 MED ORDER — NITROFURANTOIN MONOHYD MACRO 100 MG PO CAPS: 100.0000 mg | ORAL_CAPSULE | Freq: Two times a day (BID) | ORAL | Status: DC

## 2015-07-29 MED ORDER — HYDROCODONE-ACETAMINOPHEN 10-325 MG PO TABS: 1.0000 | ORAL_TABLET | Freq: Three times a day (TID) | ORAL | Status: DC | PRN

## 2015-07-29 MED ORDER — ALPRAZOLAM 0.5 MG PO TABS: 0.5000 mg | ORAL_TABLET | Freq: Three times a day (TID) | ORAL | Status: DC | PRN

## 2015-07-29 MED ORDER — HYDROXYZINE HCL 25 MG PO TABS: ORAL_TABLET | ORAL | Status: DC

## 2015-07-29 MED ORDER — ALPRAZOLAM 0.5 MG PO TBDP: 0.5000 mg | ORAL_TABLET | Freq: Two times a day (BID) | ORAL | Status: DC | PRN
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
## 2015-08-06 ENCOUNTER — Telehealth: Payer: Self-pay | Admitting: Family Medicine

## 2015-08-12 ENCOUNTER — Encounter: Payer: Self-pay | Admitting: Family Medicine

## 2015-08-12 ENCOUNTER — Ambulatory Visit (INDEPENDENT_AMBULATORY_CARE_PROVIDER_SITE_OTHER): Payer: Medicaid Other | Admitting: Family Medicine

## 2015-08-12 VITALS — BP 112/73 | HR 86 | Temp 99.2°F | Ht 61.0 in | Wt 102.2 lb

## 2015-08-12 DIAGNOSIS — R350 Frequency of micturition: Secondary | ICD-10-CM

## 2015-08-12 DIAGNOSIS — R109 Unspecified abdominal pain: Secondary | ICD-10-CM | POA: Diagnosis not present

## 2015-08-12 DIAGNOSIS — N200 Calculus of kidney: Secondary | ICD-10-CM

## 2015-08-12 DIAGNOSIS — F4323 Adjustment disorder with mixed anxiety and depressed mood: Secondary | ICD-10-CM | POA: Diagnosis not present

## 2015-08-12 LAB — POCT UA - MICROSCOPIC ONLY
Bacteria, U Microscopic: NEGATIVE
CRYSTALS, UR, HPF, POC: NEGATIVE
Casts, Ur, LPF, POC: NEGATIVE
RBC, URINE, MICROSCOPIC: NEGATIVE
Yeast, UA: NEGATIVE

## 2015-08-12 LAB — POCT URINALYSIS DIPSTICK
BILIRUBIN UA: NEGATIVE
Blood, UA: NEGATIVE
Glucose, UA: NEGATIVE
KETONES UA: NEGATIVE
Leukocytes, UA: NEGATIVE
Nitrite, UA: NEGATIVE
PROTEIN UA: NEGATIVE
SPEC GRAV UA: 1.025
Urobilinogen, UA: NEGATIVE
pH, UA: 6.5

## 2015-08-12 MED ORDER — TAMSULOSIN HCL 0.4 MG PO CAPS: 0.4000 mg | ORAL_CAPSULE | Freq: Every day | ORAL | Status: DC

## 2015-08-12 MED ORDER — CEFTRIAXONE SODIUM 1 G IJ SOLR
1.0000 g | Freq: Once | INTRAMUSCULAR | Status: AC
Start: 2015-08-12 — End: 2015-08-12
  Administered 2015-08-12: 1 g via INTRAMUSCULAR

## 2015-08-12 MED ORDER — HYDROCODONE-ACETAMINOPHEN 10-325 MG PO TABS: 1.0000 | ORAL_TABLET | Freq: Three times a day (TID) | ORAL | Status: DC | PRN

## 2015-08-12 MED ORDER — KETOROLAC TROMETHAMINE 30 MG/ML IJ SOLN
30.0000 mg | Freq: Once | INTRAMUSCULAR | Status: AC
  Administered 2015-08-12: 30 mg via INTRAMUSCULAR

## 2015-08-12 NOTE — Patient Instructions (Signed)
Ureteral Colic (Kidney Stones) °Ureteral colic is the result of a condition when kidney stones form inside the kidney. Once kidney stones are formed they may move into the tube that connects the kidney with the bladder (ureter). If this occurs, this condition may cause pain (colic) in the ureter.  °CAUSES  °Pain is caused by stone movement in the ureter and the obstruction caused by the stone. °SYMPTOMS  °The pain comes and goes as the ureter contracts around the stone. The pain is usually intense, sharp, and stabbing in character. The location of the pain may move as the stone moves through the ureter. When the stone is near the kidney the pain is usually located in the back and radiates to the belly (abdomen). When the stone is ready to pass into the bladder the pain is often located in the lower abdomen on the side the stone is located. At this location, the symptoms may mimic those of a urinary tract infection with urinary frequency. Once the stone is located here it often passes into the bladder and the pain disappears completely. °TREATMENT  °· Your caregiver will provide you with medicine for pain relief. °· You may require specialized follow-up X-rays. °· The absence of pain does not always mean that the stone has passed. It may have just stopped moving. If the urine remains completely obstructed, it can cause loss of kidney function or even complete destruction of the involved kidney. It is your responsibility and in your interest that X-rays and follow-ups as suggested by your caregiver are completed. Relief of pain without passage of the stone can be associated with severe damage to the kidney, including loss of kidney function on that side. °· If your stone does not pass on its own, additional measures may be taken by your caregiver to ensure its removal. °HOME CARE INSTRUCTIONS  °· Increase your fluid intake. Water is the preferred fluid since juices containing vitamin C may acidify the urine making it  less likely for certain stones (uric acid stones) to pass. °· Strain all urine. A strainer will be provided. Keep all particulate matter or stones for your caregiver to inspect. °· Take your pain medicine as directed. °· Make a follow-up appointment with your caregiver as directed. °· Remember that the goal is passage of your stone. The absence of pain does not mean the stone is gone. Follow your caregiver's instructions. °· Only take over-the-counter or prescription medicines for pain, discomfort, or fever as directed by your caregiver. °SEEK MEDICAL CARE IF:  °· Pain cannot be controlled with the prescribed medicine. °· You have a fever. °· Pain continues for longer than your caregiver advises it should. °· There is a change in the pain, and you develop chest discomfort or constant abdominal pain. °· You feel faint or pass out. °MAKE SURE YOU:  °· Understand these instructions. °· Will watch your condition. °· Will get help right away if you are not doing well or get worse. °Document Released: 08/24/2005 Document Revised: 03/11/2013 Document Reviewed: 05/11/2011 °[employer]® Patient Information ©2015 [employer]. This information is not intended to replace advice given to you by your health care provider. Make sure you discuss any questions you have with your health care provider. ° °

## 2015-08-12 NOTE — Progress Notes (Signed)
BP 112/73 mmHg  Pulse 86  Temp(Src) 99.2 F (37.3 C) (Oral)  Ht  (1.549 m)  Wt 102 lb 3.2 oz (46.358 kg)  BMI 19.32 kg/m2  LMP 10/28/2011   Subjective:    Patient ID: Katelyn [name], female    DOB: 06-07-1986, 29 y.o.   MRN: [id number]  HPI: Katelyn [name] is a 29 y.o. female presenting on 08/12/2015 for Urinary pain; Abdominal Pain; Back Pain; and Fever   HPI Dysuria Patient has been having recurrent dysuria with bladder infections. She has had 3 since the beginning of August. She does already have an appointment with urology but it is in a week or 2. She complains of right abdominal pain and right-sided back pain. She also stated that she had 101.6 fever this a.m. and take ibuprofen 3 hours ago which brought it down. She does complain of darkened and malodorous urine. She also complains of pain extending from down in the right side of her groin/suprapubic region up to her right flank. She has had kidney stones previously and they felt somewhat like this.  Anxiety/depression Patient has seen Lynwood Dawley here in office for anxiety and depression and was given a short course of Xanax and told to go talk to psychiatry but psychiatry told her she needs a referral so we'll do referral today.  Relevant past medical, surgical, family and social history reviewed and updated as indicated. Interim medical history since our last visit reviewed. Allergies and medications reviewed and updated.  Review of Systems  Constitutional: Negative for fever and chills.  HENT: Negative for congestion, ear discharge and ear pain.   Eyes: Negative for redness and visual disturbance.  Respiratory: Negative for chest tightness and shortness of breath.   Cardiovascular: Negative for chest pain and leg swelling.  Gastrointestinal: Positive for nausea and abdominal pain. Negative for vomiting, diarrhea, constipation, blood in stool and abdominal distention.  Genitourinary: Positive for dysuria,  urgency, frequency and flank pain. Negative for difficulty urinating.  Musculoskeletal: Negative for back pain and gait problem.  Skin: Negative for rash.  Neurological: Negative for light-headedness and headaches.  Psychiatric/Behavioral: Negative for behavioral problems and agitation.  All other systems reviewed and are negative.   Per HPI unless specifically indicated above     Medication List       This list is accurate as of: 08/12/15  4:13 PM.  Always use your most recent med list.               ALPRAZolam 0.5 MG tablet  Commonly known as:  XANAX  Take 1 tablet (0.5 mg total) by mouth 3 (three) times daily as needed for anxiety.     HYDROcodone-acetaminophen 10-325 MG per tablet  Commonly known as:  NORCO  Take 1 tablet by mouth every 8 (eight) hours as needed.     hydrOXYzine 25 MG tablet  Commonly known as:  ATARAX/VISTARIL  1 tablet BID prn for itch     nitrofurantoin (macrocrystal-monohydrate) 100 MG capsule  Commonly known as:  MACROBID  Take 1 capsule (100 mg total) by mouth 2 (two) times daily.     tamsulosin 0.4 MG Caps capsule  Commonly known as:  FLOMAX  Take 1 capsule (0.4 mg total) by mouth daily.           Objective:    BP 112/73 mmHg  Pulse 86  Temp(Src) 99.2 F (37.3 C) (Oral)  Ht  (1.549 m)  Wt 102 lb 3.2 oz (46.358  kg)  BMI 19.32 kg/m2  LMP 10/28/2011  Wt Readings from Last 3 Encounters:  08/12/15 102 lb 3.2 oz (46.358 kg)  07/29/15 106 lb (48.081 kg)  01/01/15 116 lb 9.6 oz (52.889 kg)    Physical Exam  Constitutional: She is oriented to person, place, and time. She appears well-developed and well-nourished. No distress.  Eyes: Conjunctivae and EOM are normal. Pupils are equal, round, and reactive to light.  Cardiovascular: Normal rate and regular rhythm.   No murmur heard. Pulmonary/Chest: Effort normal and breath sounds normal. No respiratory distress. She has no wheezes.  Abdominal: Normal appearance and bowel sounds are  normal. She exhibits no distension. There is no hepatosplenomegaly. There is tenderness in the right lower quadrant and suprapubic area. There is CVA tenderness. There is no rigidity, no rebound, no guarding, no tenderness at McBurney's point and negative Murphy's sign.  Musculoskeletal: Normal range of motion. She exhibits no edema or tenderness.  Neurological: She is alert and oriented to person, place, and time. Coordination normal.  Skin: Skin is warm and dry. No rash noted. She is not diaphoretic.  Psychiatric: She has a normal mood and affect. Her behavior is normal.  Vitals reviewed.   Results for orders placed or performed in visit on 08/12/15  POCT UA - Microscopic Only  Result Value Ref Range   WBC, Ur, HPF, POC 3-5    RBC, urine, microscopic neg    Bacteria, U Microscopic neg    Mucus, UA mod    Epithelial cells, urine per micros few    Crystals, Ur, HPF, POC neg    Casts, Ur, LPF, POC neg    Yeast, UA neg   POCT urinalysis dipstick  Result Value Ref Range   Color, UA gold    Clarity, UA clear    Glucose, UA neg    Bilirubin, UA neg    Ketones, UA neg    Spec Grav, UA 1.025    Blood, UA neg    pH, UA 6.5    Protein, UA neg    Urobilinogen, UA negative    Nitrite, UA neg    Leukocytes, UA Negative Negative      Assessment & Plan:   Problem List Items Addressed This Visit    None    Visit Diagnoses    Urinary frequency    -  Primary    Patient has flank pain and urinary frequency with dysuria. We will treat with antibiotic of Rocephin but will send for a CT because this could be renal stones    Relevant Orders    POCT UA - Microscopic Only (Completed)    POCT urinalysis dipstick (Completed)    Renal stones        Relevant Medications    HYDROcodone-acetaminophen (NORCO) 10-325 MG per tablet    ketorolac (TORADOL) 30 MG/ML injection 30 mg (Completed)    cefTRIAXone (ROCEPHIN) injection 1 g (Completed)    Flank pain        Relevant Medications     HYDROcodone-acetaminophen (NORCO) 10-325 MG per tablet    Adjustment disorder with mixed anxiety and depressed mood        She tried to call make an appointment with psychiatry and her insurance told her that she needed a referral.    Relevant Orders    Ambulatory referral to Psychiatry        Follow up plan: Return if symptoms worsen or fail to improve.  Arville Care, MD [facility]  08/12/2015, 4:13 PM

## 2015-08-13 ENCOUNTER — Encounter: Payer: Self-pay | Admitting: Family Medicine

## 2015-08-17 ENCOUNTER — Emergency Department (HOSPITAL_COMMUNITY): Payer: Medicaid Other

## 2015-08-17 ENCOUNTER — Encounter (HOSPITAL_COMMUNITY): Payer: Self-pay | Admitting: *Deleted

## 2015-08-17 ENCOUNTER — Emergency Department (HOSPITAL_COMMUNITY)
Admission: EM | Admit: 2015-08-17 | Discharge: 2015-08-17 | Disposition: A | Discharge status: Home / Self Care | Payer: Medicaid Other | Attending: Emergency Medicine | Admitting: Emergency Medicine

## 2015-08-17 DIAGNOSIS — R109 Unspecified abdominal pain: Secondary | ICD-10-CM

## 2015-08-17 DIAGNOSIS — Z90710 Acquired absence of both cervix and uterus: Secondary | ICD-10-CM | POA: Insufficient documentation

## 2015-08-17 DIAGNOSIS — M545 Low back pain: Secondary | ICD-10-CM | POA: Diagnosis not present

## 2015-08-17 DIAGNOSIS — R112 Nausea with vomiting, unspecified: Secondary | ICD-10-CM | POA: Insufficient documentation

## 2015-08-17 DIAGNOSIS — R519 Headache, unspecified: Secondary | ICD-10-CM

## 2015-08-17 DIAGNOSIS — R531 Weakness: Secondary | ICD-10-CM | POA: Diagnosis not present

## 2015-08-17 DIAGNOSIS — Z862 Personal history of diseases of the blood and blood-forming organs and certain disorders involving the immune mechanism: Secondary | ICD-10-CM | POA: Insufficient documentation

## 2015-08-17 DIAGNOSIS — Z87442 Personal history of urinary calculi: Secondary | ICD-10-CM | POA: Diagnosis not present

## 2015-08-17 DIAGNOSIS — R300 Dysuria: Secondary | ICD-10-CM | POA: Diagnosis not present

## 2015-08-17 DIAGNOSIS — R51 Headache: Secondary | ICD-10-CM | POA: Diagnosis not present

## 2015-08-17 DIAGNOSIS — Z8719 Personal history of other diseases of the digestive system: Secondary | ICD-10-CM | POA: Insufficient documentation

## 2015-08-17 DIAGNOSIS — Z880 Allergy status to penicillin: Secondary | ICD-10-CM | POA: Insufficient documentation

## 2015-08-17 DIAGNOSIS — Z79899 Other long term (current) drug therapy: Secondary | ICD-10-CM | POA: Insufficient documentation

## 2015-08-17 DIAGNOSIS — R319 Hematuria, unspecified: Secondary | ICD-10-CM | POA: Diagnosis not present

## 2015-08-17 DIAGNOSIS — R55 Syncope and collapse: Secondary | ICD-10-CM | POA: Insufficient documentation

## 2015-08-17 DIAGNOSIS — R42 Dizziness and giddiness: Secondary | ICD-10-CM | POA: Insufficient documentation

## 2015-08-17 DIAGNOSIS — Z8742 Personal history of other diseases of the female genital tract: Secondary | ICD-10-CM | POA: Diagnosis not present

## 2015-08-17 DIAGNOSIS — Z720 Tobacco use: Secondary | ICD-10-CM | POA: Diagnosis not present

## 2015-08-17 LAB — COMPREHENSIVE METABOLIC PANEL
ALT: 17 U/L (ref 14–54)
AST: 20 U/L (ref 15–41)
Albumin: 3.7 g/dL (ref 3.5–5.0)
Alkaline Phosphatase: 63 U/L (ref 38–126)
Anion gap: 4 — ABNORMAL LOW (ref 5–15)
BUN: 15 mg/dL (ref 6–20)
CHLORIDE: 109 mmol/L (ref 101–111)
CO2: 26 mmol/L (ref 22–32)
Calcium: 8.9 mg/dL (ref 8.9–10.3)
Creatinine, Ser: 0.56 mg/dL (ref 0.44–1.00)
Glucose, Bld: 93 mg/dL (ref 65–99)
POTASSIUM: 4.4 mmol/L (ref 3.5–5.1)
SODIUM: 139 mmol/L (ref 135–145)
Total Bilirubin: 0.5 mg/dL (ref 0.3–1.2)
Total Protein: 6.5 g/dL (ref 6.5–8.1)

## 2015-08-17 LAB — URINALYSIS, ROUTINE W REFLEX MICROSCOPIC
Bilirubin Urine: NEGATIVE
Glucose, UA: NEGATIVE mg/dL
Hgb urine dipstick: NEGATIVE
Ketones, ur: NEGATIVE mg/dL
NITRITE: NEGATIVE
PROTEIN: NEGATIVE mg/dL
SPECIFIC GRAVITY, URINE: 1.008 (ref 1.005–1.030)
UROBILINOGEN UA: 0.2 mg/dL (ref 0.0–1.0)
pH: 7 (ref 5.0–8.0)

## 2015-08-17 LAB — CBC
HEMATOCRIT: 38.8 % (ref 36.0–46.0)
HEMOGLOBIN: 12.3 g/dL (ref 12.0–15.0)
MCH: 27.4 pg (ref 26.0–34.0)
MCHC: 31.7 g/dL (ref 30.0–36.0)
MCV: 86.4 fL (ref 78.0–100.0)
Platelets: 140 10*3/uL — ABNORMAL LOW (ref 150–400)
RBC: 4.49 MIL/uL (ref 3.87–5.11)
RDW: 14 % (ref 11.5–15.5)
WBC: 7.9 10*3/uL (ref 4.0–10.5)

## 2015-08-17 LAB — URINE MICROSCOPIC-ADD ON

## 2015-08-17 MED ORDER — DIPHENHYDRAMINE HCL 50 MG/ML IJ SOLN
25.0000 mg | Freq: Once | INTRAMUSCULAR | Status: AC
  Administered 2015-08-17: 25 mg via INTRAVENOUS
  Filled 2015-08-17: qty 1

## 2015-08-17 MED ORDER — KETOROLAC TROMETHAMINE 30 MG/ML IJ SOLN
30.0000 mg | Freq: Once | INTRAMUSCULAR | Status: AC
  Administered 2015-08-17: 30 mg via INTRAVENOUS
  Filled 2015-08-17: qty 1

## 2015-08-17 MED ORDER — FENTANYL CITRATE (PF) 100 MCG/2ML IJ SOLN
100.0000 ug | Freq: Once | INTRAMUSCULAR | Status: AC
  Administered 2015-08-17: 100 ug via INTRAVENOUS
  Filled 2015-08-17: qty 2

## 2015-08-17 MED ORDER — PROCHLORPERAZINE EDISYLATE 5 MG/ML IJ SOLN
10.0000 mg | Freq: Once | INTRAMUSCULAR | Status: AC
  Administered 2015-08-17: 10 mg via INTRAVENOUS
  Filled 2015-08-17: qty 2

## 2015-08-17 MED ORDER — SODIUM CHLORIDE 0.9 % IV BOLUS (SEPSIS)
1000.0000 mL | Freq: Once | INTRAVENOUS | Status: AC
  Administered 2015-08-17: 1000 mL via INTRAVENOUS

## 2015-08-17 MED ORDER — ONDANSETRON HCL 4 MG/2ML IJ SOLN
4.0000 mg | Freq: Once | INTRAMUSCULAR | Status: AC
  Administered 2015-08-17: 4 mg via INTRAVENOUS
  Filled 2015-08-17: qty 2

## 2015-08-17 NOTE — ED Notes (Signed)
MD at bedside. 
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
## 2015-08-17 NOTE — ED Notes (Signed)
Bed: ZO10 Expected date:  Expected time:  Means of arrival:  Comments: 28 F flank pain, 227fent

## 2015-08-17 NOTE — ED Notes (Signed)
Pt arrives via EMS from home, with c/o flank pain. Pt reports she has history of kidney stones and passed some stones several days ago. Pt has also had nausea and vomiting. Pt received 250 fentanyl en route to facility

## 2015-08-17 NOTE — Discharge Instructions (Signed)
Abdominal Pain, Women °Abdominal (stomach, pelvic, or belly) pain can be caused by many things. It is important to tell your doctor: °· The location of the pain. °· Does it come and go or is it present all the time? °· Are there things that start the pain (eating certain foods, exercise)? °· Are there other symptoms associated with the pain (fever, nausea, vomiting, diarrhea)? °All of this is helpful to know when trying to find the cause of the pain. °CAUSES  °· Stomach: virus or bacteria infection, or ulcer. °· Intestine: appendicitis (inflamed appendix), regional ileitis (Crohn's disease), ulcerative colitis (inflamed colon), irritable bowel syndrome, diverticulitis (inflamed diverticulum of the colon), or cancer of the stomach or intestine. °· Gallbladder disease or stones in the gallbladder. °· Kidney disease, kidney stones, or infection. °· Pancreas infection or cancer. °· Fibromyalgia (pain disorder). °· Diseases of the female organs: °· Uterus: fibroid (non-cancerous) tumors or infection. °· Fallopian tubes: infection or tubal pregnancy. °· Ovary: cysts or tumors. °· Pelvic adhesions (scar tissue). °· Endometriosis (uterus lining tissue growing in the pelvis and on the pelvic organs). °· Pelvic congestion syndrome (female organs filling up with blood just before the menstrual period). °· Pain with the menstrual period. °· Pain with ovulation (producing an egg). °· Pain with an IUD (intrauterine device, birth control) in the uterus. °· Cancer of the female organs. °· Functional pain (pain not caused by a disease, may improve without treatment). °· Psychological pain. °· Depression. °DIAGNOSIS  °Your doctor will decide the seriousness of your pain by doing an examination. °· Blood tests. °· X-rays. °· Ultrasound. °· CT scan (computed tomography, special type of X-ray). °· MRI (magnetic resonance imaging). °· Cultures, for infection. °· Barium enema (dye inserted in the large intestine, to better view it with  X-rays). °· Colonoscopy (looking in intestine with a lighted tube). °· Laparoscopy (minor surgery, looking in abdomen with a lighted tube). °· Major abdominal exploratory surgery (looking in abdomen with a large incision). °TREATMENT  °The treatment will depend on the cause of the pain.  °· Many cases can be observed and treated at home. °· Over-the-counter medicines recommended by your caregiver. °· Prescription medicine. °· Antibiotics, for infection. °· Birth control pills, for painful periods or for ovulation pain. °· Hormone treatment, for endometriosis. °· Nerve blocking injections. °· Physical therapy. °· Antidepressants. °· Counseling with a psychologist or psychiatrist. °· Minor or major surgery. °HOME CARE INSTRUCTIONS  °· Do not take laxatives, unless directed by your caregiver. °· Take over-the-counter pain medicine only if ordered by your caregiver. Do not take aspirin because it can cause an upset stomach or bleeding. °· Try a clear liquid diet (broth or water) as ordered by your caregiver. Slowly move to a bland diet, as tolerated, if the pain is related to the stomach or intestine. °· Have a thermometer and take your temperature several times a day, and record it. °· Bed rest and sleep, if it helps the pain. °· Avoid sexual intercourse, if it causes pain. °· Avoid stressful situations. °· Keep your follow-up appointments and tests, as your caregiver orders. °· If the pain does not go away with medicine or surgery, you may try: °¨ Acupuncture. °¨ Relaxation exercises (yoga, meditation). °¨ Group therapy. °¨ Counseling. °SEEK MEDICAL CARE IF:  °· You notice certain foods cause stomach pain. °· Your home care treatment is not helping your pain. °· You need stronger pain medicine. °· You want your IUD removed. °· You feel faint or   lightheaded.  You develop nausea and vomiting.  You develop a rash.  You are having side effects or an allergy to your medicine. SEEK IMMEDIATE MEDICAL CARE IF:   Your  pain does not go away or gets worse.  You have a fever.  Your pain is felt only in portions of the abdomen. The right side could possibly be appendicitis. The left lower portion of the abdomen could be colitis or diverticulitis.  You are passing blood in your stools (bright red or black tarry stools, with or without vomiting).  You have blood in your urine.  You develop chills, with or without a fever.  You pass out. MAKE SURE YOU:   Understand these instructions.  Will watch your condition.  Will get help right away if you are not doing well or get worse. Document Released: 09/11/2007 Document Revised: 03/31/2014 Document Reviewed: 10/01/2009 [facility] Patient Information 2015 [place], [place]. This information is not intended to replace advice given to you by your health care provider. Make sure you discuss any questions you have with your health care provider.    Flank Pain Flank pain refers to pain that is located on the side of the body between the upper abdomen and the back. The pain may occur over a short period of time (acute) or may be long-term or reoccurring (chronic). It may be mild or severe. Flank pain can be caused by many things. CAUSES  Some of the more common causes of flank pain include:  Muscle strains.   Muscle spasms.   A disease of your spine (vertebral disk disease).   A lung infection (pneumonia).   Fluid around your lungs (pulmonary edema).   A kidney infection.   Kidney stones.   A very painful skin rash caused by the chickenpox virus (shingles).   Gallbladder disease.  HOME CARE INSTRUCTIONS  Home care will depend on the cause of your pain. In general,  Rest as directed by your caregiver.  Drink enough fluids to keep your urine clear or pale yellow.  Only take over-the-counter or prescription medicines as directed by your caregiver. Some medicines may help relieve the pain.  Tell your caregiver about any changes in your  pain.  Follow up with your caregiver as directed. SEEK IMMEDIATE MEDICAL CARE IF:   Your pain is not controlled with medicine.   You have new or worsening symptoms.  Your pain increases.   You have abdominal pain.   You have shortness of breath.   You have persistent nausea or vomiting.   You have swelling in your abdomen.   You feel faint or pass out.   You have blood in your urine.  You have a fever or persistent symptoms for more than 2-3 days.  You have a fever and your symptoms suddenly get worse. MAKE SURE YOU:   Understand these instructions.  Will watch your condition.  Will get help right away if you are not doing well or get worse. Document Released: 01/05/2006 Document Revised: 08/08/2012 Document Reviewed: 06/28/2012 [facility] Patient Information 2015 [place], [place]. This information is not intended to replace advice given to you by your health care provider. Make sure you discuss any questions you have with your health care provider.    Migraine Headache A migraine headache is an intense, throbbing pain on one or both sides of your head. A migraine can last for 30 minutes to several hours. CAUSES  The exact cause of a migraine headache is not always known. However, a  migraine may be caused when nerves in the brain become irritated and release chemicals that cause inflammation. This causes pain. Certain things may also trigger migraines, such as:  Alcohol.  Smoking.  Stress.  Menstruation.  Aged cheeses.  Foods or drinks that contain nitrates, glutamate, aspartame, or tyramine.  Lack of sleep.  Chocolate.  Caffeine.  Hunger.  Physical exertion.  Fatigue.  Medicines used to treat chest pain (nitroglycerine), birth control pills, estrogen, and some blood pressure medicines. SIGNS AND SYMPTOMS  Pain on one or both sides of your head.  Pulsating or throbbing pain.  Severe pain that prevents daily activities.  Pain that is  aggravated by any physical activity.  Nausea, vomiting, or both.  Dizziness.  Pain with exposure to bright lights, loud noises, or activity.  General sensitivity to bright lights, loud noises, or smells. Before you get a migraine, you may get warning signs that a migraine is coming (aura). An aura may include:  Seeing flashing lights.  Seeing bright spots, halos, or zigzag lines.  Having tunnel vision or blurred vision.  Having feelings of numbness or tingling.  Having trouble talking.  Having muscle weakness. DIAGNOSIS  A migraine headache is often diagnosed based on:  Symptoms.  Physical exam.  A CT scan or MRI of your head. These imaging tests cannot diagnose migraines, but they can help rule out other causes of headaches. TREATMENT Medicines may be given for pain and nausea. Medicines can also be given to help prevent recurrent migraines.  HOME CARE INSTRUCTIONS  Only take over-the-counter or prescription medicines for pain or discomfort as directed by your health care provider. The use of long-term narcotics is not recommended.  Lie down in a dark, quiet room when you have a migraine.  Keep a journal to find out what may trigger your migraine headaches. For example, write down:  What you eat and drink.  How much sleep you get.  Any change to your diet or medicines.  Limit alcohol consumption.  Quit smoking if you smoke.  Get 7-9 hours of sleep, or as recommended by your health care provider.  Limit stress.  Keep lights dim if bright lights bother you and make your migraines worse. SEEK IMMEDIATE MEDICAL CARE IF:   Your migraine becomes severe.  You have a fever.  You have a stiff neck.  You have vision loss.  You have muscular weakness or loss of muscle control.  You start losing your balance or have trouble walking.  You feel faint or pass out.  You have severe symptoms that are different from your first symptoms. MAKE SURE YOU:    Understand these instructions.  Will watch your condition.  Will get help right away if you are not doing well or get worse. Document Released: 11/14/2005 Document Revised: 03/31/2014 Document Reviewed: 07/22/2013 [facility] Patient Information 2015 [place], [place]. This information is not intended to replace advice given to you by your health care provider. Make sure you discuss any questions you have with your health care provider.

## 2015-08-17 NOTE — ED Notes (Signed)
Patient was alert, oriented and stable upon discharge. RN went over AVS and patient had no further questions.  

## 2015-08-18 NOTE — Telephone Encounter (Signed)
Pt was seen in our office by Dr.Dettinger 07/12/15 and then in the ED 8/19 for this same problem. Will close encounter.

## 2015-08-20 ENCOUNTER — Ambulatory Visit (INDEPENDENT_AMBULATORY_CARE_PROVIDER_SITE_OTHER): Payer: Medicaid Other | Admitting: Physician Assistant

## 2015-08-20 ENCOUNTER — Encounter: Payer: Self-pay | Admitting: Physician Assistant

## 2015-08-20 VITALS — BP 121/88 | HR 93 | Temp 98.1°F | Ht 61.0 in | Wt 106.6 lb

## 2015-08-20 DIAGNOSIS — R109 Unspecified abdominal pain: Secondary | ICD-10-CM | POA: Diagnosis not present

## 2015-08-20 DIAGNOSIS — Z87440 Personal history of urinary (tract) infections: Secondary | ICD-10-CM

## 2015-08-20 DIAGNOSIS — F411 Generalized anxiety disorder: Secondary | ICD-10-CM

## 2015-08-20 LAB — POCT URINALYSIS DIPSTICK
Bilirubin, UA: NEGATIVE
Blood, UA: NEGATIVE
Glucose, UA: NEGATIVE
Ketones, UA: NEGATIVE
NITRITE UA: NEGATIVE
PROTEIN UA: NEGATIVE
UROBILINOGEN UA: NEGATIVE
pH, UA: 6

## 2015-08-20 LAB — POCT UA - MICROSCOPIC ONLY
Casts, Ur, LPF, POC: NEGATIVE
Crystals, Ur, HPF, POC: NEGATIVE
MUCUS UA: NEGATIVE
RBC, URINE, MICROSCOPIC: NEGATIVE
WBC, UR, HPF, POC: NEGATIVE
YEAST UA: NEGATIVE

## 2015-08-20 MED ORDER — ALPRAZOLAM 0.5 MG PO TABS: 0.5000 mg | ORAL_TABLET | Freq: Three times a day (TID) | ORAL | Status: DC | PRN
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
## 2015-09-22 ENCOUNTER — Encounter (HOSPITAL_COMMUNITY): Payer: Self-pay | Admitting: *Deleted

## 2015-09-22 ENCOUNTER — Emergency Department (HOSPITAL_COMMUNITY)
Admission: EM | Admit: 2015-09-22 | Discharge: 2015-09-23 | Disposition: A | Discharge status: Home / Self Care | Payer: Medicaid Other | Attending: Emergency Medicine | Admitting: Emergency Medicine

## 2015-09-22 ENCOUNTER — Emergency Department (HOSPITAL_COMMUNITY): Payer: Medicaid Other

## 2015-09-22 DIAGNOSIS — W01198A Fall on same level from slipping, tripping and stumbling with subsequent striking against other object, initial encounter: Secondary | ICD-10-CM | POA: Insufficient documentation

## 2015-09-22 DIAGNOSIS — Y9389 Activity, other specified: Secondary | ICD-10-CM | POA: Insufficient documentation

## 2015-09-22 DIAGNOSIS — S0083XA Contusion of other part of head, initial encounter: Secondary | ICD-10-CM | POA: Insufficient documentation

## 2015-09-22 DIAGNOSIS — F419 Anxiety disorder, unspecified: Secondary | ICD-10-CM | POA: Insufficient documentation

## 2015-09-22 DIAGNOSIS — Z720 Tobacco use: Secondary | ICD-10-CM | POA: Insufficient documentation

## 2015-09-22 DIAGNOSIS — Y9289 Other specified places as the place of occurrence of the external cause: Secondary | ICD-10-CM | POA: Diagnosis not present

## 2015-09-22 DIAGNOSIS — S060X1A Concussion with loss of consciousness of 30 minutes or less, initial encounter: Secondary | ICD-10-CM | POA: Insufficient documentation

## 2015-09-22 DIAGNOSIS — Z8619 Personal history of other infectious and parasitic diseases: Secondary | ICD-10-CM | POA: Insufficient documentation

## 2015-09-22 DIAGNOSIS — Z880 Allergy status to penicillin: Secondary | ICD-10-CM | POA: Diagnosis not present

## 2015-09-22 DIAGNOSIS — Y998 Other external cause status: Secondary | ICD-10-CM | POA: Diagnosis not present

## 2015-09-22 DIAGNOSIS — Z87448 Personal history of other diseases of urinary system: Secondary | ICD-10-CM | POA: Insufficient documentation

## 2015-09-22 DIAGNOSIS — Z8719 Personal history of other diseases of the digestive system: Secondary | ICD-10-CM | POA: Insufficient documentation

## 2015-09-22 DIAGNOSIS — D649 Anemia, unspecified: Secondary | ICD-10-CM | POA: Insufficient documentation

## 2015-09-22 DIAGNOSIS — S0990XA Unspecified injury of head, initial encounter: Secondary | ICD-10-CM | POA: Diagnosis present

## 2015-09-22 MED ORDER — DIPHENHYDRAMINE HCL 50 MG/ML IJ SOLN
25.0000 mg | Freq: Once | INTRAMUSCULAR | Status: AC
  Administered 2015-09-22: 25 mg via INTRAMUSCULAR
  Filled 2015-09-22: qty 1

## 2015-09-22 MED ORDER — PROCHLORPERAZINE EDISYLATE 5 MG/ML IJ SOLN
10.0000 mg | Freq: Once | INTRAMUSCULAR | Status: AC
  Administered 2015-09-22: 10 mg via INTRAMUSCULAR
  Filled 2015-09-22: qty 2

## 2015-09-22 MED ORDER — PROMETHAZINE HCL 25 MG PO TABS
25.0000 mg | ORAL_TABLET | Freq: Four times a day (QID) | ORAL | Status: DC | PRN
Start: 2015-09-22 — End: 2016-05-18

## 2015-09-22 MED ORDER — KETOROLAC TROMETHAMINE 60 MG/2ML IM SOLN
60.0000 mg | Freq: Once | INTRAMUSCULAR | Status: AC
  Administered 2015-09-22: 60 mg via INTRAMUSCULAR
  Filled 2015-09-22: qty 2

## 2015-09-22 MED ORDER — TRAMADOL HCL 50 MG PO TABS: 50.0000 mg | ORAL_TABLET | Freq: Four times a day (QID) | ORAL | Status: DC | PRN

## 2015-09-22 NOTE — ED Notes (Signed)
Pt reports she fell last night, sts has syncopal episodes since she was child, unknown cause, [facility] but it was "crowded" and she couldn't wait. C/o headache, blurred vision and hearing "issues".
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
## 2015-09-22 NOTE — ED Notes (Signed)
Patient c/o recent fall last night. Reports syncope before fall but says, "I have an extensive history of passing out. Usually I have signs before I pass out like tunnel vision but I didn't have any of that. He (significant other) said it looked liked my head was doing circles and spinning around right before I fell. My head hit in between the dryer door and the dryer. I have these knots on the back of my head. [facility]. The nurse told me I may need a CT scan though." Endorses intermittent blurry vision and emesis x2 today. No active nausea/vomiting at this time. A&Ox4. Moving all extremities. Neurologically intact. MD Pollina at bedside.

## 2015-09-22 NOTE — Discharge Instructions (Signed)
Concussion, Adult  A concussion, or closed-head injury, is a brain injury caused by a direct blow to the head or by a quick and sudden movement (jolt) of the head or neck. Concussions are usually not life-threatening. Even so, the effects of a concussion can be serious. If you have had a concussion before, you are more likely to experience concussion-like symptoms after a direct blow to the head.   CAUSES  · Direct blow to the head, such as from running into another player during a soccer game, being hit in a fight, or hitting your head on a hard surface.  · A jolt of the head or neck that causes the brain to move back and forth inside the skull, such as in a car crash.  SIGNS AND SYMPTOMS  The signs of a concussion can be hard to notice. Early on, they may be missed by you, family members, and health care providers. You may look fine but act or feel differently.  Symptoms are usually temporary, but they may last for days, weeks, or even longer. Some symptoms may appear right away while others may not show up for hours or days. Every head injury is different. Symptoms include:  · Mild to moderate headaches that will not go away.  · A feeling of pressure inside your head.  · Having more trouble than usual:    Learning or remembering things you have heard.    Answering questions.    Paying attention or concentrating.    Organizing daily tasks.    Making decisions and solving problems.  · Slowness in thinking, acting or reacting, speaking, or reading.  · Getting lost or being easily confused.  · Feeling tired all the time or lacking energy (fatigued).  · Feeling drowsy.  · Sleep disturbances.    Sleeping more than usual.    Sleeping less than usual.    Trouble falling asleep.    Trouble sleeping (insomnia).  · Loss of balance or feeling lightheaded or dizzy.  · Nausea or vomiting.  · Numbness or tingling.  · Increased sensitivity to:    Sounds.    Lights.    Distractions.  · Vision problems or eyes that tire  easily.  · Diminished sense of taste or smell.  · Ringing in the ears.  · Mood changes such as feeling sad or anxious.  · Becoming easily irritated or angry for little or no reason.  · Lack of motivation.  · Seeing or hearing things other people do not see or hear (hallucinations).  DIAGNOSIS  Your health care provider can usually diagnose a concussion based on a description of your injury and symptoms. He or she will ask whether you passed out (lost consciousness) and whether you are having trouble remembering events that happened right before and during your injury.  Your evaluation might include:  · A brain scan to look for signs of injury to the brain. Even if the test shows no injury, you may still have a concussion.  · Blood tests to be sure other problems are not present.  TREATMENT  · Concussions are usually treated in an emergency department, in urgent care, or at a clinic. You may need to stay in the hospital overnight for further treatment.  · Tell your health care provider if you are taking any medicines, including prescription medicines, over-the-counter medicines, and natural remedies. Some medicines, such as blood thinners (anticoagulants) and aspirin, may increase the chance of complications. Also tell your health care   provider whether you have had alcohol or are taking illegal drugs. This information may affect treatment.  · Your health care provider will send you home with important instructions to follow.  · How fast you will recover from a concussion depends on many factors. These factors include how severe your concussion is, what part of your brain was injured, your age, and how healthy you were before the concussion.  · Most people with mild injuries recover fully. Recovery can take time. In general, recovery is slower in older persons. Also, persons who have had a concussion in the past or have other medical problems may find that it takes longer to recover from their current injury.  HOME  CARE INSTRUCTIONS  General Instructions  · Carefully follow the directions your health care provider gave you.  · Only take over-the-counter or prescription medicines for pain, discomfort, or fever as directed by your health care provider.  · Take only those medicines that your health care provider has approved.  · Do not drink alcohol until your health care provider says you are well enough to do so. Alcohol and certain other drugs may slow your recovery and can put you at risk of further injury.  · If it is harder than usual to remember things, write them down.  · If you are easily distracted, try to do one thing at a time. For example, do not try to watch TV while fixing dinner.  · Talk with family members or close friends when making important decisions.  · Keep all follow-up appointments. Repeated evaluation of your symptoms is recommended for your recovery.  · Watch your symptoms and tell others to do the same. Complications sometimes occur after a concussion. Older adults with a brain injury may have a higher risk of serious complications, such as a blood clot on the brain.  · Tell your teachers, school nurse, school counselor, coach, athletic trainer, or work manager about your injury, symptoms, and restrictions. Tell them about what you can or cannot do. They should watch for:    Increased problems with attention or concentration.    Increased difficulty remembering or learning new information.    Increased time needed to complete tasks or assignments.    Increased irritability or decreased ability to cope with stress.    Increased symptoms.  · Rest. Rest helps the brain to heal. Make sure you:    Get plenty of sleep at night. Avoid staying up late at night.    Keep the same bedtime hours on weekends and weekdays.    Rest during the day. Take daytime naps or rest breaks when you feel tired.  · Limit activities that require a lot of thought or concentration. These include:    Doing homework or job-related  work.    Watching TV.    Working on the computer.  · Avoid any situation where there is potential for another head injury (football, hockey, soccer, basketball, martial arts, downhill snow sports and horseback riding). Your condition will get worse every time you experience a concussion. You should avoid these activities until you are evaluated by the appropriate follow-up health care providers.  Returning To Your Regular Activities  You will need to return to your normal activities slowly, not all at once. You must give your body and brain enough time for recovery.  · Do not return to sports or other athletic activities until your health care provider tells you it is safe to do so.  · Ask   your health care provider when you can drive, ride a bicycle, or operate heavy machinery. Your ability to react may be slower after a brain injury. Never do these activities if you are dizzy.  · Ask your health care provider about when you can return to work or school.  Preventing Another Concussion  It is very important to avoid another brain injury, especially before you have recovered. In rare cases, another injury can lead to permanent brain damage, brain swelling, or death. The risk of this is greatest during the first 7-10 days after a head injury. Avoid injuries by:  · Wearing a seat belt when riding in a car.  · Drinking alcohol only in moderation.  · Wearing a helmet when biking, skiing, skateboarding, skating, or doing similar activities.  · Avoiding activities that could lead to a second concussion, such as contact or recreational sports, until your health care provider says it is okay.  · Taking safety measures in your home.    Remove clutter and tripping hazards from floors and stairways.    Use grab bars in bathrooms and handrails by stairs.    Place non-slip mats on floors and in bathtubs.    Improve lighting in dim areas.  SEEK MEDICAL CARE IF:  · You have increased problems paying attention or  concentrating.  · You have increased difficulty remembering or learning new information.  · You need more time to complete tasks or assignments than before.  · You have increased irritability or decreased ability to cope with stress.  · You have more symptoms than before.  Seek medical care if you have any of the following symptoms for more than 2 weeks after your injury:  · Lasting (chronic) headaches.  · Dizziness or balance problems.  · Nausea.  · Vision problems.  · Increased sensitivity to noise or light.  · Depression or mood swings.  · Anxiety or irritability.  · Memory problems.  · Difficulty concentrating or paying attention.  · Sleep problems.  · Feeling tired all the time.  SEEK IMMEDIATE MEDICAL CARE IF:  · You have severe or worsening headaches. These may be a sign of a blood clot in the brain.  · You have weakness (even if only in one hand, leg, or part of the face).  · You have numbness.  · You have decreased coordination.  · You vomit repeatedly.  · You have increased sleepiness.  · One pupil is larger than the other.  · You have convulsions.  · You have slurred speech.  · You have increased confusion. This may be a sign of a blood clot in the brain.  · You have increased restlessness, agitation, or irritability.  · You are unable to recognize people or places.  · You have neck pain.  · It is difficult to wake you up.  · You have unusual behavior changes.  · You lose consciousness.  MAKE SURE YOU:  · Understand these instructions.  · Will watch your condition.  · Will get help right away if you are not doing well or get worse.     This information is not intended to replace advice given to you by your health care provider. Make sure you discuss any questions you have with your health care provider.     Document Released: 02/04/2004 Document Revised: 12/05/2014 Document Reviewed: 06/06/2013  Elsevier Interactive Patient Education ©2016 [employer]

## 2015-09-23 NOTE — ED Notes (Signed)
Pt A&Ox4. Ambulatory with steady gait. Knows to follow up with [facility] 09/28/15 at 1030 am. No other c/c. Neurologically intact. Advised against ETOH and aspirin use and to increase fluids and amount of rest.

## 2015-09-27 ENCOUNTER — Encounter (HOSPITAL_COMMUNITY): Payer: Self-pay | Admitting: *Deleted

## 2015-09-27 ENCOUNTER — Emergency Department (HOSPITAL_COMMUNITY)
Admission: EM | Admit: 2015-09-27 | Discharge: 2015-09-28 | Disposition: A | Discharge status: Home / Self Care | Payer: Medicaid Other | Attending: Emergency Medicine | Admitting: Emergency Medicine

## 2015-09-27 DIAGNOSIS — Z880 Allergy status to penicillin: Secondary | ICD-10-CM | POA: Insufficient documentation

## 2015-09-27 DIAGNOSIS — F419 Anxiety disorder, unspecified: Secondary | ICD-10-CM | POA: Diagnosis not present

## 2015-09-27 DIAGNOSIS — Z79899 Other long term (current) drug therapy: Secondary | ICD-10-CM | POA: Diagnosis not present

## 2015-09-27 DIAGNOSIS — Z8619 Personal history of other infectious and parasitic diseases: Secondary | ICD-10-CM | POA: Diagnosis not present

## 2015-09-27 DIAGNOSIS — R220 Localized swelling, mass and lump, head: Secondary | ICD-10-CM | POA: Diagnosis present

## 2015-09-27 DIAGNOSIS — D649 Anemia, unspecified: Secondary | ICD-10-CM | POA: Insufficient documentation

## 2015-09-27 DIAGNOSIS — Z8679 Personal history of other diseases of the circulatory system: Secondary | ICD-10-CM | POA: Insufficient documentation

## 2015-09-27 DIAGNOSIS — K047 Periapical abscess without sinus: Secondary | ICD-10-CM | POA: Diagnosis not present

## 2015-09-27 DIAGNOSIS — Z8742 Personal history of other diseases of the female genital tract: Secondary | ICD-10-CM | POA: Insufficient documentation

## 2015-09-27 DIAGNOSIS — Z720 Tobacco use: Secondary | ICD-10-CM | POA: Diagnosis not present

## 2015-09-27 DIAGNOSIS — K029 Dental caries, unspecified: Secondary | ICD-10-CM | POA: Insufficient documentation

## 2015-09-27 DIAGNOSIS — K002 Abnormalities of size and form of teeth: Secondary | ICD-10-CM | POA: Insufficient documentation

## 2015-09-27 NOTE — ED Notes (Signed)
Pt states that she began to have swelling to her mouth yesterday; pt states that she has had multiple abscessed teeth in the past and that this one began yesterday; pt states that the pain got worse tonight and she asked her significant other one to bring her to the hospital; pt with swelling to her upper lip area; swelling extends to the right side if face to cheek area; pt unable to stay awake during triage; pt swaying in chair and closing her eyes; pt has to be stimulated to voice or touch to complete triage; pt advised "I took one pain pill before I came"

## 2015-09-28 ENCOUNTER — Ambulatory Visit: Payer: Medicaid Other | Admitting: Neurology

## 2015-09-28 MED ORDER — CLINDAMYCIN HCL 300 MG PO CAPS
300.0000 mg | ORAL_CAPSULE | Freq: Once | ORAL | Status: AC
  Administered 2015-09-28: 300 mg via ORAL
  Filled 2015-09-28: qty 1

## 2015-09-28 MED ORDER — CLINDAMYCIN HCL 150 MG PO CAPS: 150.0000 mg | ORAL_CAPSULE | Freq: Four times a day (QID) | ORAL | Status: DC

## 2015-09-28 MED ORDER — KETOROLAC TROMETHAMINE 60 MG/2ML IM SOLN
60.0000 mg | Freq: Once | INTRAMUSCULAR | Status: AC
  Administered 2015-09-28: 60 mg via INTRAMUSCULAR
  Filled 2015-09-28: qty 2

## 2015-09-28 NOTE — Discharge Instructions (Signed)
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
## 2015-10-15 ENCOUNTER — Encounter: Payer: Self-pay | Admitting: Family Medicine

## 2015-10-15 ENCOUNTER — Other Ambulatory Visit: Payer: Self-pay | Admitting: Family Medicine

## 2015-10-15 ENCOUNTER — Ambulatory Visit (INDEPENDENT_AMBULATORY_CARE_PROVIDER_SITE_OTHER): Payer: Medicaid Other | Admitting: Family Medicine

## 2015-10-15 ENCOUNTER — Ambulatory Visit: Payer: Medicaid Other | Admitting: Pediatrics

## 2015-10-15 VITALS — BP 114/85 | HR 98 | Temp 98.9°F | Ht 61.0 in | Wt 110.6 lb

## 2015-10-15 DIAGNOSIS — F418 Other specified anxiety disorders: Secondary | ICD-10-CM | POA: Diagnosis not present

## 2015-10-15 DIAGNOSIS — F419 Anxiety disorder, unspecified: Principal | ICD-10-CM

## 2015-10-15 DIAGNOSIS — F329 Major depressive disorder, single episode, unspecified: Secondary | ICD-10-CM | POA: Insufficient documentation

## 2015-10-15 MED ORDER — SERTRALINE HCL 100 MG PO TABS: 100.0000 mg | ORAL_TABLET | Freq: Every day | ORAL | Status: DC

## 2015-10-15 MED ORDER — BUSPIRONE HCL 10 MG PO TABS: 10.0000 mg | ORAL_TABLET | Freq: Two times a day (BID) | ORAL | Status: AC | PRN

## 2015-10-15 NOTE — Assessment & Plan Note (Signed)
Patient denies suicidal ideations, patient given list of counselors and crisis hotline. We'll start on Zoloft and buspirone and see how it goes for her. We'll have her back in 4 weeks counseled on what to watch for if suicidal ideations arise and who to call.

## 2015-10-15 NOTE — Progress Notes (Signed)
BP 114/85 mmHg  Pulse 98  Temp(Src) 98.9 F (37.2 C) (Oral)  Ht 5\' 1"  (1.549 m)  Wt 110 lb 9.6 oz (50.168 kg)  BMI 20.91 kg/m2  LMP 10/28/2011   Subjective:    Patient ID: Katelyn [name], female    DOB: [birth year], 29 y.o.   MRN: [id number]  HPI: Katelyn HaringChristina G Gammon is a 29 y.o. female presenting on 10/15/2015 for Anxiety   HPI Anxiety and depression Patient comes in crying and saying that she is having a lot of sadness and depression and difficulty sleeping. She also says she is having a lot of anxiety and anxiety attacks. She denies any suicidal ideations or thoughts of hurting herself. She says she has her 2 children and they are her drive to keep going and staying alive. She has had anxiety and depression off and on for all of her life and has been on multiple different medications for this. She cannot specifically remember any antidepressants. We did refer her to psychiatry at the last visit and she did not know and refuses to go. We gave her numbers for counselors and recommended that she go see somebody to talk to. Her depression is driven a lot by the loss of her significant other who is the father of her children about 6 or 7 months ago. She said her recent flare is worsened by her father-in-law passing recently within the last month. Talked to her about grieving and how she goes through the grieving process and it appears she hasn't really done it extensively.  Relevant past medical, surgical, family and social history reviewed and updated as indicated. Interim medical history since our last visit reviewed. Allergies and medications reviewed and updated.  Review of Systems  Constitutional: Positive for appetite change. Negative for fever and chills.  HENT: Negative for congestion, ear discharge and ear pain.   Eyes: Negative for redness and visual disturbance.  Respiratory: Negative for chest tightness and shortness of breath.   Cardiovascular: Negative for chest pain and  leg swelling.  Genitourinary: Negative for dysuria and difficulty urinating.  Musculoskeletal: Negative for back pain and gait problem.  Skin: Negative for rash.  Neurological: Negative for light-headedness and headaches.  Psychiatric/Behavioral: Positive for sleep disturbance, dysphoric mood, decreased concentration and agitation. Negative for suicidal ideas, behavioral problems and self-injury. The patient is nervous/anxious. The patient is not hyperactive.   All other systems reviewed and are negative.   Per HPI unless specifically indicated above     Medication List       This list is accurate as of: 10/15/15  3:45 PM.  Always use your most recent med list.               busPIRone 10 MG tablet  Commonly known as:  BUSPAR  Take 1 tablet (10 mg total) by mouth 2 (two) times daily as needed.     clindamycin 150 MG capsule  Commonly known as:  CLEOCIN  Take 1 capsule (150 mg total) by mouth every 6 (six) hours.     diphenhydrAMINE 25 MG tablet  Commonly known as:  SOMINEX  Take 50 mg by mouth at bedtime as needed for itching or sleep.     FISH OIL PO  Take 1 capsule by mouth at bedtime.     glucosamine-chondroitin 500-400 MG tablet  Take 1 tablet by mouth at bedtime.     GOODY HEADACHE PO  Take 1 each by mouth daily as needed (headache.).  HYDROcodone-acetaminophen 5-325 MG tablet  Commonly known as:  NORCO/VICODIN  Take 1 tablet by mouth every 6 (six) hours as needed for moderate pain.     Iron 325 (65 FE) MG Tabs  Take 1 tablet by mouth at bedtime.     promethazine 25 MG tablet  Commonly known as:  PHENERGAN  Take 1 tablet (25 mg total) by mouth every 6 (six) hours as needed (headache).     sertraline 100 MG tablet  Commonly known as:  ZOLOFT  Take 1 tablet (100 mg total) by mouth daily.     traMADol 50 MG tablet  Commonly known as:  ULTRAM  Take 1 tablet (50 mg total) by mouth every 6 (six) hours as needed.     VITAMIN E PO  Take 1 tablet by mouth  at bedtime.           Objective:    BP 114/85 mmHg  Pulse 98  Temp(Src) 98.9 F (37.2 C) (Oral)  Ht  (1.549 m)  Wt 110 lb 9.6 oz (50.168 kg)  BMI 20.91 kg/m2  LMP 10/28/2011  Wt Readings from Last 3 Encounters:  10/15/15 110 lb 9.6 oz (50.168 kg)  08/20/15 106 lb 9.6 oz (48.353 kg)  08/17/15 102 lb (46.267 kg)    Physical Exam  Constitutional: She is oriented to person, place, and time. She appears well-developed and well-nourished. No distress.  Eyes: Conjunctivae and EOM are normal. Pupils are equal, round, and reactive to light.  Cardiovascular: Normal rate, regular rhythm, normal heart sounds and intact distal pulses.   No murmur heard. Pulmonary/Chest: Effort normal and breath sounds normal. No respiratory distress. She has no wheezes.  Musculoskeletal: Normal range of motion. She exhibits no edema or tenderness.  Neurological: She is alert and oriented to person, place, and time. Coordination normal.  Skin: Skin is warm and dry. No rash noted. She is not diaphoretic.  Psychiatric: Her speech is normal and behavior is normal. Thought content normal. Her mood appears anxious. Her affect is blunt and labile. Her affect is not angry and not inappropriate. Cognition and memory are normal. She expresses impulsivity. She does not express inappropriate judgment. She exhibits a depressed mood. She expresses no suicidal ideation. She expresses no suicidal plans.  Nursing note and vitals reviewed.   Results for orders placed or performed in visit on 08/20/15  POCT urinalysis dipstick  Result Value Ref Range   Color, UA clear    Clarity, UA clear    Glucose, UA neg    Bilirubin, UA neg    Ketones, UA neg    Spec Grav, UA <=1.005    Blood, UA neg    pH, UA 6.0    Protein, UA neg    Urobilinogen, UA negative    Nitrite, UA neg    Leukocytes, UA Trace (A) Negative  POCT UA - Microscopic Only  Result Value Ref Range   WBC, Ur, HPF, POC neg    RBC, urine, microscopic neg     Bacteria, U Microscopic occ    Mucus, UA neg    Epithelial cells, urine per micros occ    Crystals, Ur, HPF, POC neg    Casts, Ur, LPF, POC neg    Yeast, UA neg       Assessment & Plan:       Problem List Items Addressed This Visit      Other   Anxiety and depression - Primary    Patient denies suicidal ideations,  patient given list of counselors and crisis hotline. We'll start on Zoloft and buspirone and see how it goes for her. We'll have her back in 4 weeks counseled on what to watch for if suicidal ideations arise and who to call.      Relevant Medications   sertraline (ZOLOFT) 100 MG tablet   busPIRone (BUSPAR) 10 MG tablet       Follow up plan: Return in about 4 weeks (around 11/12/2015), or if symptoms worsen or fail to improve.  Counseling provided for all of the vaccine components No orders of the defined types were placed in this encounter.    Arville Care, MD [facility] 10/15/2015, 3:45 PM

## 2015-10-16 ENCOUNTER — Telehealth: Payer: Self-pay | Admitting: Family Medicine

## 2015-10-16 DIAGNOSIS — F419 Anxiety disorder, unspecified: Principal | ICD-10-CM

## 2015-10-16 DIAGNOSIS — F329 Major depressive disorder, single episode, unspecified: Secondary | ICD-10-CM

## 2015-10-16 MED ORDER — SERTRALINE HCL 100 MG PO TABS: 100.0000 mg | ORAL_TABLET | Freq: Every day | ORAL | Status: AC

## 2015-10-16 NOTE — Telephone Encounter (Signed)
Is it ok for me to change to a 90 day supply?

## 2015-10-16 NOTE — Telephone Encounter (Signed)
Patient aware that rx has been sent in for 90.

## 2015-10-16 NOTE — Telephone Encounter (Signed)
Yes it is okay to change her to a 90 day prescription without a refill. I want to see her back in a month so I don't want to push it out too far.

## 2015-11-16 ENCOUNTER — Ambulatory Visit: Payer: Medicaid Other | Admitting: Family Medicine

## 2015-11-17 ENCOUNTER — Encounter: Payer: Self-pay | Admitting: Family Medicine

## 2015-11-27 ENCOUNTER — Ambulatory Visit: Payer: Medicaid Other | Admitting: Neurology

## 2015-12-03 ENCOUNTER — Ambulatory Visit: Payer: Medicaid Other | Admitting: Family Medicine

## 2015-12-04 ENCOUNTER — Encounter: Payer: Self-pay | Admitting: Family Medicine

## 2015-12-11 ENCOUNTER — Telehealth: Payer: Self-pay | Admitting: Family Medicine

## 2015-12-11 NOTE — Telephone Encounter (Signed)
Patient given an appointment for Monday with [facility].

## 2015-12-14 ENCOUNTER — Ambulatory Visit: Payer: Medicaid Other | Admitting: Family Medicine

## 2015-12-16 ENCOUNTER — Encounter: Payer: Self-pay | Admitting: Family Medicine

## 2016-05-18 ENCOUNTER — Emergency Department (HOSPITAL_COMMUNITY): Payer: Medicaid Other

## 2016-05-18 ENCOUNTER — Encounter (HOSPITAL_COMMUNITY): Payer: Self-pay | Admitting: Emergency Medicine

## 2016-05-18 ENCOUNTER — Emergency Department (HOSPITAL_COMMUNITY)
Admission: EM | Admit: 2016-05-18 | Discharge: 2016-05-18 | Disposition: A | Discharge status: Home / Self Care | Payer: Medicaid Other | Attending: Emergency Medicine | Admitting: Emergency Medicine

## 2016-05-18 DIAGNOSIS — Z79899 Other long term (current) drug therapy: Secondary | ICD-10-CM | POA: Insufficient documentation

## 2016-05-18 DIAGNOSIS — F17210 Nicotine dependence, cigarettes, uncomplicated: Secondary | ICD-10-CM | POA: Diagnosis not present

## 2016-05-18 DIAGNOSIS — F909 Attention-deficit hyperactivity disorder, unspecified type: Secondary | ICD-10-CM | POA: Insufficient documentation

## 2016-05-18 DIAGNOSIS — Z7982 Long term (current) use of aspirin: Secondary | ICD-10-CM | POA: Diagnosis not present

## 2016-05-18 DIAGNOSIS — R1030 Lower abdominal pain, unspecified: Secondary | ICD-10-CM | POA: Insufficient documentation

## 2016-05-18 LAB — COMPREHENSIVE METABOLIC PANEL
ALT: 11 U/L — ABNORMAL LOW (ref 14–54)
AST: 17 U/L (ref 15–41)
Albumin: 4.3 g/dL (ref 3.5–5.0)
Alkaline Phosphatase: 59 U/L (ref 38–126)
Anion gap: 5 (ref 5–15)
BUN: 20 mg/dL (ref 6–20)
CO2: 30 mmol/L (ref 22–32)
Calcium: 8.8 mg/dL — ABNORMAL LOW (ref 8.9–10.3)
Chloride: 106 mmol/L (ref 101–111)
Creatinine, Ser: 0.71 mg/dL (ref 0.44–1.00)
GFR calc Af Amer: >60 mL/min (ref >60)
GFR calc non Af Amer: >60 mL/min (ref >60)
Glucose, Bld: 73 mg/dL (ref 65–99)
Potassium: 4.1 mmol/L (ref 3.5–5.1)
Sodium: 141 mmol/L (ref 135–145)
Total Bilirubin: 0.5 mg/dL (ref 0.3–1.2)
Total Protein: 6.9 g/dL (ref 6.5–8.1)

## 2016-05-18 LAB — URINALYSIS, ROUTINE W REFLEX MICROSCOPIC
Bilirubin Urine: NEGATIVE
Glucose, UA: NEGATIVE mg/dL
Hgb urine dipstick: NEGATIVE
Ketones, ur: NEGATIVE mg/dL
Leukocytes, UA: NEGATIVE
Nitrite: NEGATIVE
Protein, ur: NEGATIVE mg/dL
Specific Gravity, Urine: 1.03 (ref 1.005–1.030)
pH: 5.5 (ref 5.0–8.0)

## 2016-05-18 LAB — CBC
HCT: 37.7 % (ref 36.0–46.0)
Hemoglobin: 12.2 g/dL (ref 12.0–15.0)
MCH: 28.1 pg (ref 26.0–34.0)
MCHC: 32.4 g/dL (ref 30.0–36.0)
MCV: 86.9 fL (ref 78.0–100.0)
Platelets: 209 10*3/uL (ref 150–400)
RBC: 4.34 MIL/uL (ref 3.87–5.11)
RDW: 15.1 % (ref 11.5–15.5)
WBC: 10.6 10*3/uL — ABNORMAL HIGH (ref 4.0–10.5)

## 2016-05-18 LAB — LIPASE, BLOOD: Lipase: 20 U/L (ref 11–51)

## 2016-05-18 LAB — CBG MONITORING, ED: Glucose-Capillary: 87 mg/dL (ref 65–99)

## 2016-05-18 MED ORDER — KETOROLAC TROMETHAMINE 15 MG/ML IJ SOLN
15.0000 mg | Freq: Once | INTRAMUSCULAR | Status: AC
  Administered 2016-05-18: 15 mg via INTRAVENOUS
  Filled 2016-05-18: qty 1

## 2016-05-18 MED ORDER — LORAZEPAM 2 MG/ML IJ SOLN
0.5000 mg | Freq: Once | INTRAMUSCULAR | Status: AC
  Administered 2016-05-18: 0.5 mg via INTRAVENOUS
  Filled 2016-05-18: qty 1

## 2016-05-18 MED ORDER — HYDROMORPHONE HCL 1 MG/ML IJ SOLN
0.7500 mg | Freq: Once | INTRAMUSCULAR | Status: AC
  Administered 2016-05-18: 0.75 mg via INTRAVENOUS
  Filled 2016-05-18: qty 1

## 2016-05-18 MED ORDER — TRAMADOL HCL 50 MG PO TABS: 50.0000 mg | ORAL_TABLET | Freq: Four times a day (QID) | ORAL | Status: DC | PRN

## 2016-05-18 MED ORDER — DIATRIZOATE MEGLUMINE & SODIUM 66-10 % PO SOLN: 15.0000 mL | Freq: Once | ORAL | Status: DC

## 2016-05-18 MED ORDER — SODIUM CHLORIDE 0.9 % IV BOLUS (SEPSIS)
2000.0000 mL | Freq: Once | INTRAVENOUS | Status: AC
  Administered 2016-05-18: 2000 mL via INTRAVENOUS

## 2016-05-18 MED ORDER — IOPAMIDOL (ISOVUE-300) INJECTION 61%
100.0000 mL | Freq: Once | INTRAVENOUS | Status: AC | PRN
  Administered 2016-05-18: 100 mL via INTRAVENOUS

## 2016-05-18 NOTE — ED Notes (Signed)
Pt requesting to speak to MD.  MD made aware 

## 2016-05-18 NOTE — ED Notes (Signed)
Bed: WA18 Expected date:  Expected time:  Means of arrival:  Comments: EMS-hypotensive 
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
## 2016-05-18 NOTE — ED Notes (Signed)
2nd Attempt at voiding urine. Pt ambulated to restroom and was unable to void. Attempted I/O cath, unsuccessful - resistance. Bladder scanned Pt - est 200mL of fluid. Notified Dr. Juleen ChinaKohut.

## 2016-05-18 NOTE — ED Notes (Signed)
Pt ambulated to restroom and was given cup and asked to get a urine sample. Pt stated she couldn't pee, but there was urine in the toilet with paper

## 2016-05-18 NOTE — ED Notes (Signed)
Pt transported to CT ?

## 2016-05-18 NOTE — Discharge Instructions (Signed)

## 2016-05-18 NOTE — ED Notes (Signed)
MD aware of patient's blood pressure reading.

## 2016-05-18 NOTE — ED Notes (Signed)
Patient went to restroom and did not catch urine sample.

## 2016-05-18 NOTE — ED Notes (Signed)
Patient aware urine sample is needed. Patient states she is unable to void at this time.

## 2016-05-18 NOTE — ED Notes (Signed)
MD at bedside. 

## 2016-05-18 NOTE — ED Notes (Signed)
Per EMS, patient fell today in kitchen. Family found patient on floor. Patient reports she was hurting, so she passed out. Reports intermittent nausea, vomiting, lower abdominal pain, diarrhea, dysuria since Sunday. Poor appetite and fluid intake. Patient's sister states she could not find a pulse on patient and "started CPR". EMS could not verify actual loss of consciousness or loss of pulse. Patient was alert, oriented upon EMS arrival. Sinus rhythm on monitor. Patient pale with delayed cap refill. Denies alcohol or drug use today. Denies pregnancy. Hx of anemia, diverticulitis, IBS. 50 mcg of fentanyl and 450 mL NS in route with EMS.

## 2016-05-19 LAB — GC/CHLAMYDIA PROBE AMP (~~LOC~~) NOT AT ARMC
Chlamydia: NEGATIVE
Neisseria Gonorrhea: NEGATIVE

## 2016-10-14 ENCOUNTER — Emergency Department (HOSPITAL_COMMUNITY)
Admission: EM | Admit: 2016-10-14 | Discharge: 2016-10-14 | Disposition: A | Discharge status: Home / Self Care | Payer: Medicaid Other | Attending: Emergency Medicine | Admitting: Emergency Medicine

## 2016-10-14 ENCOUNTER — Emergency Department (HOSPITAL_COMMUNITY): Payer: Medicaid Other

## 2016-10-14 ENCOUNTER — Encounter (HOSPITAL_COMMUNITY): Payer: Self-pay | Admitting: Emergency Medicine

## 2016-10-14 DIAGNOSIS — Z79899 Other long term (current) drug therapy: Secondary | ICD-10-CM | POA: Diagnosis not present

## 2016-10-14 DIAGNOSIS — F17210 Nicotine dependence, cigarettes, uncomplicated: Secondary | ICD-10-CM | POA: Insufficient documentation

## 2016-10-14 DIAGNOSIS — R1033 Periumbilical pain: Secondary | ICD-10-CM | POA: Diagnosis present

## 2016-10-14 DIAGNOSIS — F909 Attention-deficit hyperactivity disorder, unspecified type: Secondary | ICD-10-CM | POA: Insufficient documentation

## 2016-10-14 DIAGNOSIS — Z7982 Long term (current) use of aspirin: Secondary | ICD-10-CM | POA: Insufficient documentation

## 2016-10-14 LAB — COMPREHENSIVE METABOLIC PANEL
ALBUMIN: 4.2 g/dL (ref 3.5–5.0)
ALT: 10 U/L — AB (ref 14–54)
AST: 20 U/L (ref 15–41)
Alkaline Phosphatase: 76 U/L (ref 38–126)
Anion gap: 6 (ref 5–15)
BUN: 10 mg/dL (ref 6–20)
CHLORIDE: 105 mmol/L (ref 101–111)
CO2: 31 mmol/L (ref 22–32)
CREATININE: 0.82 mg/dL (ref 0.44–1.00)
Calcium: 9.2 mg/dL (ref 8.9–10.3)
GFR calc Af Amer: >60 mL/min (ref >60)
GLUCOSE: 74 mg/dL (ref 65–99)
POTASSIUM: 3.8 mmol/L (ref 3.5–5.1)
SODIUM: 142 mmol/L (ref 135–145)
Total Bilirubin: 0.5 mg/dL (ref 0.3–1.2)
Total Protein: 7.2 g/dL (ref 6.5–8.1)

## 2016-10-14 LAB — CBC WITH DIFFERENTIAL/PLATELET
BASOS ABS: 0 10*3/uL (ref 0.0–0.1)
BASOS PCT: 0 %
EOS ABS: 0.3 10*3/uL (ref 0.0–0.7)
EOS PCT: 3 %
HCT: 40.8 % (ref 36.0–46.0)
Hemoglobin: 13.1 g/dL (ref 12.0–15.0)
LYMPHS PCT: 35 %
Lymphs Abs: 3.9 10*3/uL (ref 0.7–4.0)
MCH: 27.6 pg (ref 26.0–34.0)
MCHC: 32.1 g/dL (ref 30.0–36.0)
MCV: 86.1 fL (ref 78.0–100.0)
MONO ABS: 0.8 10*3/uL (ref 0.1–1.0)
Monocytes Relative: 7 %
Neutro Abs: 6.2 10*3/uL (ref 1.7–7.7)
Neutrophils Relative %: 55 %
PLATELETS: 180 10*3/uL (ref 150–400)
RBC: 4.74 MIL/uL (ref 3.87–5.11)
RDW: 15 % (ref 11.5–15.5)
WBC: 11.1 10*3/uL — AB (ref 4.0–10.5)

## 2016-10-14 LAB — URINALYSIS, ROUTINE W REFLEX MICROSCOPIC
Bilirubin Urine: NEGATIVE
GLUCOSE, UA: NEGATIVE mg/dL
Hgb urine dipstick: NEGATIVE
Ketones, ur: NEGATIVE mg/dL
LEUKOCYTES UA: NEGATIVE
Nitrite: NEGATIVE
PROTEIN: NEGATIVE mg/dL
SPECIFIC GRAVITY, URINE: 1.019 (ref 1.005–1.030)
pH: 7 (ref 5.0–8.0)

## 2016-10-14 LAB — LIPASE, BLOOD: LIPASE: 16 U/L (ref 11–51)

## 2016-10-14 MED ORDER — ACETAMINOPHEN 500 MG PO TABS: 500.0000 mg | ORAL_TABLET | Freq: Four times a day (QID) | ORAL | 0 refills | Status: AC | PRN

## 2016-10-14 MED ORDER — SODIUM CHLORIDE 0.9 % IJ SOLN
INTRAMUSCULAR | Status: AC
  Filled 2016-10-14: qty 50

## 2016-10-14 MED ORDER — ONDANSETRON HCL 4 MG/2ML IJ SOLN
4.0000 mg | Freq: Once | INTRAMUSCULAR | Status: AC
  Administered 2016-10-14: 4 mg via INTRAVENOUS
  Filled 2016-10-14: qty 2

## 2016-10-14 MED ORDER — KETOROLAC TROMETHAMINE 30 MG/ML IJ SOLN
30.0000 mg | Freq: Once | INTRAMUSCULAR | Status: AC
  Administered 2016-10-14: 30 mg via INTRAVENOUS
  Filled 2016-10-14: qty 1

## 2016-10-14 MED ORDER — IOPAMIDOL (ISOVUE-300) INJECTION 61%
100.0000 mL | Freq: Once | INTRAVENOUS | Status: AC | PRN
  Administered 2016-10-14: 100 mL via INTRAVENOUS

## 2016-10-14 MED ORDER — IOPAMIDOL (ISOVUE-300) INJECTION 61%
INTRAVENOUS | Status: AC
  Filled 2016-10-14: qty 100

## 2016-10-14 MED ORDER — ONDANSETRON HCL 4 MG PO TABS: 4.0000 mg | ORAL_TABLET | Freq: Four times a day (QID) | ORAL | 0 refills | Status: AC

## 2016-10-14 NOTE — ED Notes (Signed)
Bed: WA16 Expected date:  Expected time:  Means of arrival:  Comments: EMS-OD 

## 2016-10-14 NOTE — ED Triage Notes (Addendum)
Patient is from home.  Patient is complaining of abdominal pain associated with N/V/D x 1 day. She has had abdominal pain for several days.    Patient states her mom gave her pain medication today.  (Katelyn [name])  BP: 134/98 HR:80 R:16-18 O2: 96% on room air  CBG:109
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
## 2016-10-14 NOTE — Discharge Instructions (Signed)
Please follow up with your primary care provider for further evaluation of your abdominal pain.

## 2016-10-14 NOTE — ED Notes (Addendum)
Patient is A & O x4.  She is understood discharge instructions. Patient was upset that she didn't get stronger IV pain medication

## 2016-11-30 ENCOUNTER — Emergency Department (HOSPITAL_COMMUNITY)
Admission: EM | Admit: 2016-11-30 | Discharge: 2016-11-30 | Disposition: A | Discharge status: Home / Self Care | Payer: Medicaid Other | Attending: Emergency Medicine | Admitting: Emergency Medicine

## 2016-11-30 ENCOUNTER — Emergency Department (HOSPITAL_COMMUNITY): Payer: Medicaid Other

## 2016-11-30 ENCOUNTER — Encounter (HOSPITAL_COMMUNITY): Payer: Self-pay | Admitting: Radiology

## 2016-11-30 DIAGNOSIS — F909 Attention-deficit hyperactivity disorder, unspecified type: Secondary | ICD-10-CM | POA: Diagnosis not present

## 2016-11-30 DIAGNOSIS — R1010 Upper abdominal pain, unspecified: Secondary | ICD-10-CM

## 2016-11-30 DIAGNOSIS — F17210 Nicotine dependence, cigarettes, uncomplicated: Secondary | ICD-10-CM | POA: Diagnosis not present

## 2016-11-30 DIAGNOSIS — K2970 Gastritis, unspecified, without bleeding: Secondary | ICD-10-CM | POA: Diagnosis not present

## 2016-11-30 DIAGNOSIS — Z7982 Long term (current) use of aspirin: Secondary | ICD-10-CM | POA: Insufficient documentation

## 2016-11-30 LAB — COMPREHENSIVE METABOLIC PANEL
ALK PHOS: 82 U/L (ref 38–126)
ALT: 23 U/L (ref 14–54)
ANION GAP: 8 (ref 5–15)
AST: 24 U/L (ref 15–41)
Albumin: 4.3 g/dL (ref 3.5–5.0)
BILIRUBIN TOTAL: 0.2 mg/dL — AB (ref 0.3–1.2)
BUN: 9 mg/dL (ref 6–20)
CALCIUM: 9.5 mg/dL (ref 8.9–10.3)
CO2: 27 mmol/L (ref 22–32)
Chloride: 106 mmol/L (ref 101–111)
Creatinine, Ser: 0.57 mg/dL (ref 0.44–1.00)
GFR calc Af Amer: >60 mL/min (ref >60)
Glucose, Bld: 88 mg/dL (ref 65–99)
POTASSIUM: 3.7 mmol/L (ref 3.5–5.1)
Sodium: 141 mmol/L (ref 135–145)
TOTAL PROTEIN: 7.2 g/dL (ref 6.5–8.1)

## 2016-11-30 LAB — CBC WITH DIFFERENTIAL/PLATELET
BASOS ABS: 0 10*3/uL (ref 0.0–0.1)
BASOS PCT: 0 %
EOS ABS: 0.1 10*3/uL (ref 0.0–0.7)
Eosinophils Relative: 1 %
HEMATOCRIT: 39.7 % (ref 36.0–46.0)
Hemoglobin: 12.8 g/dL (ref 12.0–15.0)
Lymphocytes Relative: 26 %
Lymphs Abs: 3.1 10*3/uL (ref 0.7–4.0)
MCH: 27.1 pg (ref 26.0–34.0)
MCHC: 32.2 g/dL (ref 30.0–36.0)
MCV: 83.9 fL (ref 78.0–100.0)
MONO ABS: 0.6 10*3/uL (ref 0.1–1.0)
MONOS PCT: 5 %
NEUTROS ABS: 8.3 10*3/uL — AB (ref 1.7–7.7)
NEUTROS PCT: 68 %
Platelets: 206 10*3/uL (ref 150–400)
RBC: 4.73 MIL/uL (ref 3.87–5.11)
RDW: 14.7 % (ref 11.5–15.5)
WBC: 12.2 10*3/uL — ABNORMAL HIGH (ref 4.0–10.5)

## 2016-11-30 LAB — URINALYSIS, ROUTINE W REFLEX MICROSCOPIC
BILIRUBIN URINE: NEGATIVE
GLUCOSE, UA: NEGATIVE mg/dL
Ketones, ur: NEGATIVE mg/dL
LEUKOCYTES UA: NEGATIVE
NITRITE: NEGATIVE
PH: 8 (ref 5.0–8.0)
Protein, ur: NEGATIVE mg/dL
SPECIFIC GRAVITY, URINE: 1.01 (ref 1.005–1.030)

## 2016-11-30 LAB — I-STAT BETA HCG BLOOD, ED (MC, WL, AP ONLY): I-stat hCG, quantitative: <5 m[IU]/mL (ref <5)

## 2016-11-30 LAB — LIPASE, BLOOD: LIPASE: 21 U/L (ref 11–51)

## 2016-11-30 MED ORDER — ONDANSETRON 4 MG PO TBDP: 4.0000 mg | ORAL_TABLET | ORAL | 0 refills | Status: DC | PRN

## 2016-11-30 MED ORDER — PANTOPRAZOLE SODIUM 20 MG PO TBEC: 20.0000 mg | DELAYED_RELEASE_TABLET | Freq: Every day | ORAL | 0 refills | Status: AC

## 2016-11-30 MED ORDER — PROMETHAZINE HCL 25 MG/ML IJ SOLN: 25.0000 mg | Freq: Once | INTRAMUSCULAR | Status: DC

## 2016-11-30 MED ORDER — DICYCLOMINE HCL 20 MG PO TABS: 20.0000 mg | ORAL_TABLET | Freq: Two times a day (BID) | ORAL | 0 refills | Status: AC

## 2016-11-30 MED ORDER — PANTOPRAZOLE SODIUM 40 MG IV SOLR
40.0000 mg | Freq: Once | INTRAVENOUS | Status: AC
  Administered 2016-11-30: 40 mg via INTRAVENOUS
  Filled 2016-11-30: qty 40

## 2016-11-30 MED ORDER — IOPAMIDOL (ISOVUE-300) INJECTION 61%
INTRAVENOUS | Status: AC
  Administered 2016-11-30: 100 mL
  Filled 2016-11-30: qty 100

## 2016-11-30 MED ORDER — SODIUM CHLORIDE 0.9 % IV SOLN
25.0000 mg | Freq: Once | INTRAVENOUS | Status: AC
  Administered 2016-11-30: 25 mg via INTRAVENOUS
  Filled 2016-11-30: qty 1

## 2016-11-30 NOTE — ED Triage Notes (Signed)
Per EMS, pt is coming from home with complaints of bilateral lower quadrant abdominal pain and left flank pain 10/10. Pt reports having coffee ground stool and retaining urine. Pt has a hx of kidney stones, IBS, and hernia. Pt received 100mcg fentanyl by EMS.
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
## 2016-11-30 NOTE — ED Notes (Signed)
Pt requested to speak with MD regarding what her results showed.  Made MD aware.  MD said she will speak with patient as she is getting discharged.

## 2017-04-21 ENCOUNTER — Encounter (HOSPITAL_COMMUNITY): Payer: Self-pay | Admitting: Emergency Medicine

## 2017-04-21 ENCOUNTER — Emergency Department (HOSPITAL_COMMUNITY)
Admission: EM | Admit: 2017-04-21 | Discharge: 2017-04-21 | Disposition: A | Discharge status: Home / Self Care | Payer: Medicaid Other | Attending: Emergency Medicine | Admitting: Emergency Medicine

## 2017-04-21 DIAGNOSIS — R21 Rash and other nonspecific skin eruption: Secondary | ICD-10-CM | POA: Diagnosis present

## 2017-04-21 DIAGNOSIS — Z5321 Procedure and treatment not carried out due to patient leaving prior to being seen by health care provider: Secondary | ICD-10-CM | POA: Insufficient documentation

## 2017-04-21 DIAGNOSIS — H9209 Otalgia, unspecified ear: Secondary | ICD-10-CM | POA: Insufficient documentation

## 2017-04-21 LAB — CBC WITH DIFFERENTIAL/PLATELET
BASOS PCT: 0 %
Basophils Absolute: 0 10*3/uL (ref 0.0–0.1)
EOS PCT: 2 %
Eosinophils Absolute: 0.2 10*3/uL (ref 0.0–0.7)
HEMATOCRIT: 41.9 % (ref 36.0–46.0)
Hemoglobin: 13.4 g/dL (ref 12.0–15.0)
Lymphocytes Relative: 40 %
Lymphs Abs: 4.6 10*3/uL — ABNORMAL HIGH (ref 0.7–4.0)
MCH: 27.1 pg (ref 26.0–34.0)
MCHC: 32 g/dL (ref 30.0–36.0)
MCV: 84.8 fL (ref 78.0–100.0)
MONO ABS: 0.9 10*3/uL (ref 0.1–1.0)
MONOS PCT: 8 %
NEUTROS ABS: 5.7 10*3/uL (ref 1.7–7.7)
Neutrophils Relative %: 50 %
PLATELETS: 194 10*3/uL (ref 150–400)
RBC: 4.94 MIL/uL (ref 3.87–5.11)
RDW: 14.5 % (ref 11.5–15.5)
WBC: 11.4 10*3/uL — ABNORMAL HIGH (ref 4.0–10.5)

## 2017-04-21 LAB — BASIC METABOLIC PANEL
Anion gap: 10 (ref 5–15)
BUN: 15 mg/dL (ref 6–20)
CALCIUM: 9.6 mg/dL (ref 8.9–10.3)
CO2: 26 mmol/L (ref 22–32)
CREATININE: 0.74 mg/dL (ref 0.44–1.00)
Chloride: 103 mmol/L (ref 101–111)
GFR calc Af Amer: >60 mL/min (ref >60)
GLUCOSE: 89 mg/dL (ref 65–99)
Potassium: 3.6 mmol/L (ref 3.5–5.1)
Sodium: 139 mmol/L (ref 135–145)

## 2017-04-21 NOTE — ED Notes (Signed)
Pt reports she does not want to wait any longer. Pt ambulatory out of the ED waiting area.

## 2017-04-21 NOTE — ED Notes (Signed)
Pt approached this EMT stating she just now passed out while sitting down. [place] RN made aware. Pt brought back to triage for bloodwork.

## 2017-04-21 NOTE — ED Notes (Signed)
Patient refused blood tests and requested to remove her identification wrist band , advised nurse that she is leaving .

## 2017-04-21 NOTE — ED Triage Notes (Signed)
Patient reports bilateral earache with itchy skin rashes at ears , abdomen and bilateral groin this week .

## 2017-04-21 NOTE — ED Notes (Signed)
Patient changed her mind and decided to stay.

## 2017-04-21 NOTE — ED Notes (Signed)
Pt. stated that she passed out several times today .

## 2017-05-13 ENCOUNTER — Emergency Department (HOSPITAL_COMMUNITY): Payer: Medicaid Other

## 2017-05-13 ENCOUNTER — Emergency Department (HOSPITAL_COMMUNITY)
Admission: EM | Admit: 2017-05-13 | Discharge: 2017-05-13 | Disposition: A | Discharge status: Home / Self Care | Payer: Medicaid Other | Attending: Emergency Medicine | Admitting: Emergency Medicine

## 2017-05-13 ENCOUNTER — Encounter (HOSPITAL_COMMUNITY): Payer: Self-pay | Admitting: Emergency Medicine

## 2017-05-13 DIAGNOSIS — R55 Syncope and collapse: Secondary | ICD-10-CM | POA: Diagnosis not present

## 2017-05-13 DIAGNOSIS — F1910 Other psychoactive substance abuse, uncomplicated: Secondary | ICD-10-CM | POA: Diagnosis not present

## 2017-05-13 DIAGNOSIS — F17210 Nicotine dependence, cigarettes, uncomplicated: Secondary | ICD-10-CM | POA: Diagnosis not present

## 2017-05-13 DIAGNOSIS — F909 Attention-deficit hyperactivity disorder, unspecified type: Secondary | ICD-10-CM | POA: Diagnosis not present

## 2017-05-13 DIAGNOSIS — Z79899 Other long term (current) drug therapy: Secondary | ICD-10-CM | POA: Insufficient documentation

## 2017-05-13 DIAGNOSIS — F419 Anxiety disorder, unspecified: Secondary | ICD-10-CM | POA: Diagnosis not present

## 2017-05-13 LAB — CBC
HEMATOCRIT: 38.6 % (ref 36.0–46.0)
Hemoglobin: 12.3 g/dL (ref 12.0–15.0)
MCH: 26.7 pg (ref 26.0–34.0)
MCHC: 31.9 g/dL (ref 30.0–36.0)
MCV: 83.9 fL (ref 78.0–100.0)
Platelets: 206 10*3/uL (ref 150–400)
RBC: 4.6 MIL/uL (ref 3.87–5.11)
RDW: 13.9 % (ref 11.5–15.5)
WBC: 12.1 10*3/uL — ABNORMAL HIGH (ref 4.0–10.5)

## 2017-05-13 LAB — RAPID URINE DRUG SCREEN, HOSP PERFORMED
AMPHETAMINES: NOT DETECTED
BARBITURATES: NOT DETECTED
Benzodiazepines: POSITIVE — AB
Cocaine: NOT DETECTED
Opiates: POSITIVE — AB
Tetrahydrocannabinol: POSITIVE — AB

## 2017-05-13 LAB — COMPREHENSIVE METABOLIC PANEL
ALBUMIN: 4 g/dL (ref 3.5–5.0)
ALK PHOS: 81 U/L (ref 38–126)
ALT: 9 U/L — AB (ref 14–54)
AST: 20 U/L (ref 15–41)
Anion gap: 12 (ref 5–15)
BUN: 9 mg/dL (ref 6–20)
CALCIUM: 9.2 mg/dL (ref 8.9–10.3)
CO2: 23 mmol/L (ref 22–32)
CREATININE: 0.76 mg/dL (ref 0.44–1.00)
Chloride: 104 mmol/L (ref 101–111)
GFR calc Af Amer: >60 mL/min (ref >60)
GFR calc non Af Amer: >60 mL/min (ref >60)
GLUCOSE: 106 mg/dL — AB (ref 65–99)
Potassium: 3.5 mmol/L (ref 3.5–5.1)
Sodium: 139 mmol/L (ref 135–145)
Total Bilirubin: 0.3 mg/dL (ref 0.3–1.2)
Total Protein: 6.7 g/dL (ref 6.5–8.1)

## 2017-05-13 LAB — ETHANOL: Alcohol, Ethyl (B): <5 mg/dL (ref <5)

## 2017-05-13 MED ORDER — HYDROXYZINE HCL 25 MG PO TABS: ORAL_TABLET | ORAL | 0 refills | Status: AC

## 2017-05-13 MED ORDER — SULFAMETHOXAZOLE-TRIMETHOPRIM 800-160 MG PO TABS: 1.0000 | ORAL_TABLET | Freq: Two times a day (BID) | ORAL | 0 refills | Status: AC

## 2017-05-13 NOTE — Discharge Instructions (Signed)
Pt is medically cleared [facility],   take the vistaril for sleep and you are put on bactrim for possible ear infection

## 2017-05-13 NOTE — ED Notes (Signed)
Pt called for triage. No response.  

## 2017-05-13 NOTE — ED Triage Notes (Signed)
Pt checked in and then wnet out to get her luggage  Then went up to the corner to smoke not checking back

## 2017-05-13 NOTE — ED Triage Notes (Signed)
Pt presents to ED for assessment after [facility] looking for [facility] detox support.  Patient was sent here for medical clearance.  Pt c/o some URI symptoms, including nasal congestion, swollen face around eyes, cough.   Patient denies SI/HI

## 2017-05-13 NOTE — ED Notes (Signed)
Denies any SI or HI want's help getting off of opiates.
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
## 2017-08-19 ENCOUNTER — Emergency Department (HOSPITAL_COMMUNITY)
Admission: EM | Admit: 2017-08-19 | Discharge: 2017-08-19 | Discharge status: Against Medical Advice | Payer: Medicaid Other

## 2017-08-19 NOTE — ED Triage Notes (Signed)
Called x 4, no answer. 

## 2018-02-20 ENCOUNTER — Inpatient Hospital Stay (HOSPITAL_COMMUNITY)
Admission: EM | Admit: 2018-02-20 | Discharge: 2018-02-21 | DRG: 872 | Discharge status: Against Medical Advice | Payer: Medicaid Other | Attending: Internal Medicine | Admitting: Internal Medicine

## 2018-02-20 ENCOUNTER — Emergency Department (HOSPITAL_COMMUNITY): Payer: Medicaid Other

## 2018-02-20 ENCOUNTER — Other Ambulatory Visit: Payer: Self-pay

## 2018-02-20 ENCOUNTER — Encounter (HOSPITAL_COMMUNITY): Payer: Self-pay | Admitting: Nurse Practitioner

## 2018-02-20 DIAGNOSIS — F17210 Nicotine dependence, cigarettes, uncomplicated: Secondary | ICD-10-CM | POA: Diagnosis present

## 2018-02-20 DIAGNOSIS — D649 Anemia, unspecified: Secondary | ICD-10-CM | POA: Diagnosis present

## 2018-02-20 DIAGNOSIS — Z881 Allergy status to other antibiotic agents status: Secondary | ICD-10-CM

## 2018-02-20 DIAGNOSIS — N179 Acute kidney failure, unspecified: Secondary | ICD-10-CM | POA: Diagnosis present

## 2018-02-20 DIAGNOSIS — Z8349 Family history of other endocrine, nutritional and metabolic diseases: Secondary | ICD-10-CM

## 2018-02-20 DIAGNOSIS — K589 Irritable bowel syndrome without diarrhea: Secondary | ICD-10-CM | POA: Diagnosis present

## 2018-02-20 DIAGNOSIS — A419 Sepsis, unspecified organism: Secondary | ICD-10-CM | POA: Diagnosis not present

## 2018-02-20 DIAGNOSIS — N12 Tubulo-interstitial nephritis, not specified as acute or chronic: Secondary | ICD-10-CM | POA: Diagnosis present

## 2018-02-20 DIAGNOSIS — Z888 Allergy status to other drugs, medicaments and biological substances status: Secondary | ICD-10-CM

## 2018-02-20 DIAGNOSIS — Z8249 Family history of ischemic heart disease and other diseases of the circulatory system: Secondary | ICD-10-CM

## 2018-02-20 DIAGNOSIS — Z818 Family history of other mental and behavioral disorders: Secondary | ICD-10-CM

## 2018-02-20 DIAGNOSIS — N390 Urinary tract infection, site not specified: Secondary | ICD-10-CM

## 2018-02-20 DIAGNOSIS — R6520 Severe sepsis without septic shock: Secondary | ICD-10-CM | POA: Diagnosis present

## 2018-02-20 DIAGNOSIS — Z90710 Acquired absence of both cervix and uterus: Secondary | ICD-10-CM

## 2018-02-20 DIAGNOSIS — Z87440 Personal history of urinary (tract) infections: Secondary | ICD-10-CM

## 2018-02-20 DIAGNOSIS — F909 Attention-deficit hyperactivity disorder, unspecified type: Secondary | ICD-10-CM | POA: Diagnosis present

## 2018-02-20 DIAGNOSIS — Z885 Allergy status to narcotic agent status: Secondary | ICD-10-CM

## 2018-02-20 LAB — URINALYSIS, ROUTINE W REFLEX MICROSCOPIC
Bilirubin Urine: NEGATIVE
GLUCOSE, UA: NEGATIVE mg/dL
Ketones, ur: NEGATIVE mg/dL
NITRITE: NEGATIVE
PROTEIN: 100 mg/dL — AB
SPECIFIC GRAVITY, URINE: 1.012 (ref 1.005–1.030)
pH: 5 (ref 5.0–8.0)

## 2018-02-20 LAB — BASIC METABOLIC PANEL
ANION GAP: 14 (ref 5–15)
BUN: 11 mg/dL (ref 6–20)
CALCIUM: 9.5 mg/dL (ref 8.9–10.3)
CHLORIDE: 92 mmol/L — AB (ref 101–111)
CO2: 27 mmol/L (ref 22–32)
CREATININE: 1.1 mg/dL — AB (ref 0.44–1.00)
GFR calc non Af Amer: >60 mL/min (ref >60)
Glucose, Bld: 133 mg/dL — ABNORMAL HIGH (ref 65–99)
Potassium: 3.5 mmol/L (ref 3.5–5.1)
SODIUM: 133 mmol/L — AB (ref 135–145)

## 2018-02-20 LAB — CBC
HCT: 43.1 % (ref 36.0–46.0)
Hemoglobin: 14.4 g/dL (ref 12.0–15.0)
MCH: 27.9 pg (ref 26.0–34.0)
MCHC: 33.4 g/dL (ref 30.0–36.0)
MCV: 83.4 fL (ref 78.0–100.0)
PLATELETS: 138 10*3/uL — AB (ref 150–400)
RBC: 5.17 MIL/uL — AB (ref 3.87–5.11)
RDW: 13.9 % (ref 11.5–15.5)
WBC: 17.5 10*3/uL — AB (ref 4.0–10.5)

## 2018-02-20 LAB — I-STAT BETA HCG BLOOD, ED (MC, WL, AP ONLY): I-stat hCG, quantitative: 7.6 m[IU]/mL — ABNORMAL HIGH (ref <5)

## 2018-02-20 LAB — I-STAT CG4 LACTIC ACID, ED: LACTIC ACID, VENOUS: 1.2 mmol/L (ref 0.5–1.9)

## 2018-02-20 MED ORDER — HYDROMORPHONE HCL 1 MG/ML IJ SOLN
0.5000 mg | Freq: Once | INTRAMUSCULAR | Status: AC
  Administered 2018-02-20: 0.5 mg via INTRAVENOUS
  Filled 2018-02-20: qty 1

## 2018-02-20 MED ORDER — ONDANSETRON HCL 4 MG/2ML IJ SOLN
4.0000 mg | Freq: Once | INTRAMUSCULAR | Status: AC
  Administered 2018-02-20: 4 mg via INTRAVENOUS
  Filled 2018-02-20: qty 2

## 2018-02-20 MED ORDER — SODIUM CHLORIDE 0.9 % IV BOLUS (SEPSIS)
1000.0000 mL | Freq: Once | INTRAVENOUS | Status: AC
  Administered 2018-02-20: 1000 mL via INTRAVENOUS

## 2018-02-20 MED ORDER — SODIUM CHLORIDE 0.9 % IV SOLN
2.0000 g | Freq: Once | INTRAVENOUS | Status: AC
  Administered 2018-02-20: 2 g via INTRAVENOUS
  Filled 2018-02-20: qty 20

## 2018-02-20 MED ORDER — SODIUM CHLORIDE 0.9 % IV BOLUS (SEPSIS)
250.0000 mL | Freq: Once | INTRAVENOUS | Status: AC
  Administered 2018-02-20: 250 mL via INTRAVENOUS

## 2018-02-20 MED ORDER — KETOROLAC TROMETHAMINE 15 MG/ML IJ SOLN
15.0000 mg | Freq: Once | INTRAMUSCULAR | Status: AC
  Administered 2018-02-20: 15 mg via INTRAVENOUS
  Filled 2018-02-20: qty 1

## 2018-02-20 MED ORDER — HYDROMORPHONE HCL 1 MG/ML IJ SOLN: 1.0000 mg | Freq: Once | INTRAMUSCULAR | Status: DC

## 2018-02-20 MED ORDER — ACETAMINOPHEN 325 MG PO TABS
650.0000 mg | ORAL_TABLET | Freq: Once | ORAL | Status: AC | PRN
  Administered 2018-02-21: 650 mg via ORAL
  Filled 2018-02-20: qty 2

## 2018-02-20 MED ORDER — SODIUM CHLORIDE 0.9 % IV BOLUS (SEPSIS)
500.0000 mL | Freq: Once | INTRAVENOUS | Status: AC
  Administered 2018-02-20: 500 mL via INTRAVENOUS

## 2018-02-20 MED ORDER — MORPHINE SULFATE (PF) 4 MG/ML IV SOLN
5.0000 mg | Freq: Once | INTRAVENOUS | Status: DC
  Filled 2018-02-20: qty 2

## 2018-02-20 MED ORDER — LACTATED RINGERS IV BOLUS
1000.0000 mL | Freq: Once | INTRAVENOUS | Status: AC
  Administered 2018-02-20: 1000 mL via INTRAVENOUS

## 2018-02-20 MED ORDER — ACETAMINOPHEN 500 MG PO TABS
1000.0000 mg | ORAL_TABLET | Freq: Once | ORAL | Status: AC
  Administered 2018-02-20: 1000 mg via ORAL
  Filled 2018-02-20: qty 2
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
## 2018-02-20 NOTE — Progress Notes (Signed)
A consult was received from an ED physician for Ceftriaxone per pharmacy dosing.  The patient's profile has been reviewed for ht/wt/allergies/indication/available labs.   A one time order has been placed for Ceftriaxone 2g.  Further antibiotics/pharmacy consults should be ordered by admitting physician if indicated.                       Thank you,  Lynann Beaverhristine Teyonna Plaisted PharmD, BCPS Pager [phone] 02/20/2018 8:32 PM

## 2018-02-20 NOTE — ED Triage Notes (Signed)
Patient came to ER today for bilateral flank pain and brown urine. Patient is unable to eat or drink. Feels her back is swollen.

## 2018-02-20 NOTE — ED Notes (Signed)
Pt does not have urge to void currently.

## 2018-02-21 ENCOUNTER — Emergency Department
Admission: EM | Admit: 2018-02-21 | Discharge: 2018-02-21 | Disposition: A | Discharge status: Home / Self Care | Payer: Medicaid Other | Attending: Emergency Medicine | Admitting: Emergency Medicine

## 2018-02-21 ENCOUNTER — Other Ambulatory Visit: Payer: Self-pay

## 2018-02-21 ENCOUNTER — Encounter: Payer: Self-pay | Admitting: Emergency Medicine

## 2018-02-21 DIAGNOSIS — F17210 Nicotine dependence, cigarettes, uncomplicated: Secondary | ICD-10-CM | POA: Insufficient documentation

## 2018-02-21 DIAGNOSIS — Z818 Family history of other mental and behavioral disorders: Secondary | ICD-10-CM | POA: Diagnosis not present

## 2018-02-21 DIAGNOSIS — K589 Irritable bowel syndrome without diarrhea: Secondary | ICD-10-CM | POA: Diagnosis present

## 2018-02-21 DIAGNOSIS — M545 Low back pain: Secondary | ICD-10-CM | POA: Diagnosis not present

## 2018-02-21 DIAGNOSIS — N12 Tubulo-interstitial nephritis, not specified as acute or chronic: Secondary | ICD-10-CM | POA: Insufficient documentation

## 2018-02-21 DIAGNOSIS — R109 Unspecified abdominal pain: Secondary | ICD-10-CM | POA: Diagnosis present

## 2018-02-21 DIAGNOSIS — Z8349 Family history of other endocrine, nutritional and metabolic diseases: Secondary | ICD-10-CM | POA: Diagnosis not present

## 2018-02-21 DIAGNOSIS — D649 Anemia, unspecified: Secondary | ICD-10-CM | POA: Diagnosis present

## 2018-02-21 DIAGNOSIS — Z8249 Family history of ischemic heart disease and other diseases of the circulatory system: Secondary | ICD-10-CM | POA: Diagnosis not present

## 2018-02-21 DIAGNOSIS — F909 Attention-deficit hyperactivity disorder, unspecified type: Secondary | ICD-10-CM | POA: Diagnosis present

## 2018-02-21 DIAGNOSIS — Z87440 Personal history of urinary (tract) infections: Secondary | ICD-10-CM | POA: Diagnosis not present

## 2018-02-21 DIAGNOSIS — N179 Acute kidney failure, unspecified: Secondary | ICD-10-CM

## 2018-02-21 DIAGNOSIS — N390 Urinary tract infection, site not specified: Secondary | ICD-10-CM

## 2018-02-21 DIAGNOSIS — R6520 Severe sepsis without septic shock: Secondary | ICD-10-CM

## 2018-02-21 DIAGNOSIS — Z881 Allergy status to other antibiotic agents status: Secondary | ICD-10-CM | POA: Diagnosis not present

## 2018-02-21 DIAGNOSIS — Z885 Allergy status to narcotic agent status: Secondary | ICD-10-CM | POA: Diagnosis not present

## 2018-02-21 DIAGNOSIS — Z888 Allergy status to other drugs, medicaments and biological substances status: Secondary | ICD-10-CM | POA: Diagnosis not present

## 2018-02-21 DIAGNOSIS — A419 Sepsis, unspecified organism: Secondary | ICD-10-CM | POA: Diagnosis not present

## 2018-02-21 DIAGNOSIS — Z90710 Acquired absence of both cervix and uterus: Secondary | ICD-10-CM | POA: Diagnosis not present

## 2018-02-21 LAB — URINALYSIS, COMPLETE (UACMP) WITH MICROSCOPIC
Bilirubin Urine: NEGATIVE
Glucose, UA: 50 mg/dL — AB
KETONES UR: NEGATIVE mg/dL
Nitrite: NEGATIVE
PROTEIN: 100 mg/dL — AB
Specific Gravity, Urine: 1.01 (ref 1.005–1.030)
pH: 5 (ref 5.0–8.0)

## 2018-02-21 LAB — BLOOD CULTURE ID PANEL (REFLEXED)
Acinetobacter baumannii: NOT DETECTED
CARBAPENEM RESISTANCE: NOT DETECTED
Candida albicans: NOT DETECTED
Candida glabrata: NOT DETECTED
Candida krusei: NOT DETECTED
Candida parapsilosis: NOT DETECTED
Candida tropicalis: NOT DETECTED
ENTEROCOCCUS SPECIES: NOT DETECTED
Enterobacter cloacae complex: NOT DETECTED
Enterobacteriaceae species: DETECTED — AB
Escherichia coli: DETECTED — AB
HAEMOPHILUS INFLUENZAE: NOT DETECTED
Klebsiella oxytoca: NOT DETECTED
Klebsiella pneumoniae: NOT DETECTED
LISTERIA MONOCYTOGENES: NOT DETECTED
NEISSERIA MENINGITIDIS: NOT DETECTED
Proteus species: NOT DETECTED
Pseudomonas aeruginosa: NOT DETECTED
STAPHYLOCOCCUS AUREUS BCID: NOT DETECTED
STAPHYLOCOCCUS SPECIES: NOT DETECTED
STREPTOCOCCUS PNEUMONIAE: NOT DETECTED
STREPTOCOCCUS PYOGENES: NOT DETECTED
STREPTOCOCCUS SPECIES: NOT DETECTED
Serratia marcescens: NOT DETECTED
Streptococcus agalactiae: NOT DETECTED

## 2018-02-21 LAB — BASIC METABOLIC PANEL
ANION GAP: 15 (ref 5–15)
Anion gap: 13 (ref 5–15)
BUN: 18 mg/dL (ref 6–20)
BUN: 14 mg/dL (ref 6–20)
CALCIUM: 8.6 mg/dL — AB (ref 8.9–10.3)
CALCIUM: 8.9 mg/dL (ref 8.9–10.3)
CHLORIDE: 99 mmol/L — AB (ref 101–111)
CO2: 25 mmol/L (ref 22–32)
CO2: 26 mmol/L (ref 22–32)
CREATININE: 1.35 mg/dL — AB (ref 0.44–1.00)
Chloride: 97 mmol/L — ABNORMAL LOW (ref 101–111)
Creatinine, Ser: 1.16 mg/dL — ABNORMAL HIGH (ref 0.44–1.00)
GFR calc non Af Amer: >60 mL/min (ref >60)
GFR, EST AFRICAN AMERICAN: 59 mL/min — AB (ref >60)
GFR, EST NON AFRICAN AMERICAN: 51 mL/min — AB (ref >60)
GLUCOSE: 111 mg/dL — AB (ref 65–99)
Glucose, Bld: 99 mg/dL (ref 65–99)
POTASSIUM: 4.2 mmol/L (ref 3.5–5.1)
POTASSIUM: 3.9 mmol/L (ref 3.5–5.1)
SODIUM: 137 mmol/L (ref 135–145)
Sodium: 138 mmol/L (ref 135–145)

## 2018-02-21 LAB — CBC WITH DIFFERENTIAL/PLATELET
BASOS ABS: 0 10*3/uL (ref 0–0.1)
BASOS PCT: 0 %
EOS PCT: 1 %
Eosinophils Absolute: 0.1 10*3/uL (ref 0–0.7)
HCT: 39.1 % (ref 35.0–47.0)
Hemoglobin: 12.7 g/dL (ref 12.0–16.0)
LYMPHS PCT: 12 %
Lymphs Abs: 1.9 10*3/uL (ref 1.0–3.6)
MCH: 26.8 pg (ref 26.0–34.0)
MCHC: 32.4 g/dL (ref 32.0–36.0)
MCV: 82.7 fL (ref 80.0–100.0)
Monocytes Absolute: 1.6 10*3/uL — ABNORMAL HIGH (ref 0.2–0.9)
Monocytes Relative: 11 %
NEUTROS ABS: 11.7 10*3/uL — AB (ref 1.4–6.5)
Neutrophils Relative %: 76 %
Platelets: 94 10*3/uL — ABNORMAL LOW (ref 150–440)
RBC: 4.73 MIL/uL (ref 3.80–5.20)
RDW: 14 % (ref 11.5–14.5)
WBC: 15.4 10*3/uL — AB (ref 3.6–11.0)

## 2018-02-21 LAB — MAGNESIUM: Magnesium: 2.3 mg/dL (ref 1.7–2.4)

## 2018-02-21 LAB — HCG, QUANTITATIVE, PREGNANCY: hCG, Beta Chain, Quant, S: 2 m[IU]/mL (ref <5)

## 2018-02-21 LAB — PHOSPHORUS: Phosphorus: 3.5 mg/dL (ref 2.5–4.6)

## 2018-02-21 MED ORDER — SODIUM CHLORIDE 0.9 % IV SOLN
1.0000 g | Freq: Once | INTRAVENOUS | Status: AC
  Administered 2018-02-21: 1 g via INTRAVENOUS
  Filled 2018-02-21: qty 10

## 2018-02-21 MED ORDER — SODIUM CHLORIDE 0.9 % IV BOLUS
1000.0000 mL | Freq: Once | INTRAVENOUS | Status: AC
  Administered 2018-02-21: 1000 mL via INTRAVENOUS

## 2018-02-21 MED ORDER — KETOROLAC TROMETHAMINE 10 MG PO TABS: 10.0000 mg | ORAL_TABLET | Freq: Three times a day (TID) | ORAL | 0 refills | Status: AC | PRN

## 2018-02-21 MED ORDER — SULFAMETHOXAZOLE-TRIMETHOPRIM 800-160 MG PO TABS: 1.0000 | ORAL_TABLET | Freq: Two times a day (BID) | ORAL | 0 refills | Status: DC

## 2018-02-21 MED ORDER — TRAMADOL HCL 50 MG PO TABS: 50.0000 mg | ORAL_TABLET | Freq: Four times a day (QID) | ORAL | 0 refills | Status: DC | PRN

## 2018-02-21 MED ORDER — HYDROMORPHONE HCL 2 MG PO TABS
1.0000 mg | ORAL_TABLET | ORAL | Status: DC | PRN
  Administered 2018-02-21: 1 mg via ORAL
  Filled 2018-02-21: qty 1

## 2018-02-21 MED ORDER — KETOROLAC TROMETHAMINE 30 MG/ML IJ SOLN
30.0000 mg | Freq: Once | INTRAMUSCULAR | Status: AC
  Administered 2018-02-21: 30 mg via INTRAVENOUS
  Filled 2018-02-21: qty 1

## 2018-02-21 MED ORDER — SODIUM CHLORIDE 0.9 % IV SOLN: 2.0000 g | INTRAVENOUS | Status: DC

## 2018-02-21 MED ORDER — HYDROMORPHONE HCL 1 MG/ML IJ SOLN
1.0000 mg | INTRAMUSCULAR | Status: DC | PRN
Start: 2018-02-21 — End: 2018-02-21
  Administered 2018-02-21 (×2): 1 mg via INTRAVENOUS
  Filled 2018-02-21 (×2): qty 1

## 2018-02-21 MED ORDER — CIPROFLOXACIN HCL 500 MG PO TABS: 500.0000 mg | ORAL_TABLET | Freq: Two times a day (BID) | ORAL | 0 refills | Status: AC

## 2018-02-21 MED ORDER — FENTANYL CITRATE (PF) 100 MCG/2ML IJ SOLN
50.0000 ug | Freq: Once | INTRAMUSCULAR | Status: AC
  Administered 2018-02-21: 50 ug via INTRAVENOUS
  Filled 2018-02-21: qty 2

## 2018-02-21 MED ORDER — ACETAMINOPHEN 500 MG PO TABS
1000.0000 mg | ORAL_TABLET | Freq: Once | ORAL | Status: AC
  Administered 2018-02-21: 1000 mg via ORAL
  Filled 2018-02-21: qty 2

## 2018-02-21 MED ORDER — ONDANSETRON HCL 4 MG/2ML IJ SOLN
4.0000 mg | Freq: Once | INTRAMUSCULAR | Status: AC
  Administered 2018-02-21: 4 mg via INTRAVENOUS
  Filled 2018-02-21: qty 2
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
## 2018-02-21 NOTE — Progress Notes (Signed)
PHARMACY NOTE -  ANTIBIOTIC RENAL DOSE ADJUSTMENT   Request received for Pharmacy to assist with antibiotic renal dose adjustment.  Patient has been initiated on Ceftriaxone 2gm iv q24hr  for UTI. SCr 1.1, estimated CrCl 54 ml/min Current dosage is appropriate and need for further dosage adjustment appears unlikely at present. Will sign off at this time.  Please reconsult if a change in clinical status warrants re-evaluation of dosage.
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
## 2018-02-21 NOTE — ED Notes (Signed)
Patient demanding to speak with physician, MD paged x3.

## 2018-02-21 NOTE — ED Notes (Signed)
Patient verbalized feeling upset to RN stating she was told by the doctor yesterday she could leave and be given prescriptions for pain medication and antibiotics and only need to be monitored for one night. Patient upset that she was not given a meal for breakfast. RN released patients diet order and had secretary order patient meal tray. Patient notified. Previous shift RN stated MD has been paged regarding patient wanting to speak with them. Secretary states paging MD again and that MD is coming to speak with patient. Significant other brings wheelchair into room and states "this is not a prison, we can do what we want and I am taking her outside for some fresh air."

## 2018-02-21 NOTE — Discharge Summary (Signed)
Physician Discharge Summary  Katelyn [name] ZOX:[id number] DOB: [birth year] DOA: 02/20/2018  PCP: Lucillie Garfinkel, MD  Admit date: 02/20/2018 Discharge date: 02/21/2018  Admitted From: Home Disposition:  Home  Recommendations for Outpatient Follow-up and new medication changes:  1. Antibiotic therapy with ciprofloxacin 500 g twice daily for 10 days 2. Pain control with Ultram 50 mg every 6 hours as needed, 10 tablets prescribed.  Home Health: no  Equipment/Devices: no   Discharge Condition: Stable CODE STATUS: full  Diet recommendation: Regular.   Brief/Interim Summary: 32 year old female who presented with flank pain.  She does have a significant past medical history for frequent urinary tract infections, anxiety, and irritable bowel syndrome.  Patient reported 3 days of bilateral flank pain , burning in nature, associated with nausea, vomiting, fever and myalgias radiated to the abdomen.  On the initial physical examination she was noted to be febrile 100.7 F, heart rate 120 bpm, blood pressure 80/60.  Dry mucous membranes, lungs clear to auscultation bilaterally, heart S1-S2 present, tachycardic, the abdomen was soft and nontender, no lower extremity edema.  Sodium 133, potassium 3.5, chloride 92, bicarb 27, glucose 133, BUN 11, creatinine 1.10, white count 17.5, hemoglobin 14.4, hematocrit 43.1, platelets 138, urinalysis with 100 protein, specific gravity 1.012, too numerous to count white cells, RBC 6-30.  h-CG 7.6.  Renal CT with no hydronephrosis or renal obstruction.  No renal or ureteral calculi.  Inflammatory changes noted around the lower pole of right kidney.  EKG sinus rhythm, 95 bpm, normal axis, normal intervals.  Patient was admitted to the hospital with a working diagnosis of sepsis due to right pyelonephritis.  1.  Sepsis due to right pyelonephritis 2.  Acute kidney injury  Patient has decided to leave the hospital AGAINST MEDICAL ADVICE, I have explained her the risk  of leaving the hospital without completing the treatment for her pyelonephritis. She fully understands the consequences including worsening pain, infection and death.  I have addressed all her questions and addressed all her concerns.  I did prescribe appropriate IV analgesia and allow her to eat, I also offered to expedite her transfer to the medical ward.  Despite all this she is still has decided to leave the hospital.  I will prescribe her ciprofloxacin for antibiotic therapy, Ultram for pain control, I advised her to return to the hospital if worsening symptoms.    Discharge Diagnoses:  Active Problems:   Pyelonephritis    Discharge Instructions   Ciprofloxacin 500 mg bid for 10 days Ultram 50 mg q 6 hours as needed for pain.   Allergies  Allergen Reactions  . Amoxicillin-Pot Clavulanate Other (See Comments)    Has patient had a PCN reaction causing immediate rash, facial/tongue/throat swelling, SOB or lightheadedness with hypotension: No Has patient had a PCN reaction causing severe rash involving mucus membranes or skin necrosis: No Has patient had a PCN reaction that required hospitalization No Has patient had a PCN reaction occurring within the last 10 years: yes  If all of the above answers are "NO", then may proceed with Cephalosporin use.Yeast infection.  . Metoclopramide Swelling  . Oxycontin [Oxycodone Hcl] Itching     severe skin itching, "patient will itch unitl her skin bleeds"  . Vicodin [Hydrocodone-Acetaminophen] Itching and Other (See Comments)    Percocet is OK per pt     Consultations:     Procedures/Studies: Ct Renal Stone Study  Result Date: 02/20/2018 CLINICAL DATA:  Bilateral flank pain. EXAM: CT ABDOMEN AND PELVIS WITHOUT CONTRAST  TECHNIQUE: Multidetector CT imaging of the abdomen and pelvis was performed following the standard protocol without IV contrast. COMPARISON:  CT scan of November 30, 2016. FINDINGS: Lower chest: No acute abnormality.  Hepatobiliary: No gallstones are noted. Rounded low density is seen in inferior portion of right hepatic lobe consistent with lesion described on prior study. It is stable in size. No biliary dilatation is noted. Pancreas: Unremarkable. No pancreatic ductal dilatation or surrounding inflammatory changes. Spleen: Normal in size without focal abnormality. Adrenals/Urinary Tract: Adrenal glands appear normal. No hydronephrosis or renal obstruction is noted. No renal or ureteral calculi are noted. Urinary bladder is unremarkable. Inflammatory changes are noted around lower pole of right kidney which may represent pyelonephritis. Stomach/Bowel: There is no evidence of bowel obstruction or inflammation. The stomach appears normal. The appendix is not visualized. Vascular/Lymphatic: No significant vascular findings are present. No enlarged abdominal or pelvic lymph nodes. Reproductive: Status post hysterectomy. No adnexal masses. Other: No abdominal wall hernia or abnormality. No abdominopelvic ascites. Musculoskeletal: No acute or significant osseous findings. IMPRESSION: No hydronephrosis or renal obstruction is noted. No renal or ureteral calculi are noted. Inflammatory changes are noted around lower pole of right kidney which may represent pyelonephritis. Clinical correlation is recommended. 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 Electronically Signed   By: Lupita Raider, M.D.   On: 02/20/2018 21:39       Subjective: Patient is feeling well, has persistent flank and abdominal pain, no nausea or vomiting, no dyspnea or chest pain.   Discharge Exam: Vitals:   02/21/18 1030 02/21/18 1130  BP: 111/75 102/72  Pulse: 90 78  Resp: 15 16  Temp:    SpO2: 97% 91%   Vitals:   02/21/18 0937 02/21/18 1000 02/21/18 1030 02/21/18 1130  BP:  98/67 111/75 102/72  Pulse:  86 90 78  Resp:  18 15 16   Temp: (!) 100.4  F (38 C)     TempSrc: Oral     SpO2:  95% 97% 91%  Weight:      Height:        General: Not in pain or dyspnea Neurology: Awake and alert, non focal  E ENT: mild pallor, no icterus, oral mucosa moist Cardiovascular: No JVD. S1-S2 present, rhythmic, no gallops, rubs, or murmurs. No lower extremity edema. Pulmonary: vesicular breath sounds bilaterally, adequate air movement, no wheezing, rhonchi or rales. Gastrointestinal. Abdomen flat, no organomegaly, no rebound or guarding. Pain to percussion at the costovertebral angles, and abdomen.  Skin. No rashes Musculoskeletal: no joint deformities   The results of significant diagnostics from this hospitalization (including imaging, microbiology, ancillary and laboratory) are listed below for reference.     Microbiology: Recent Results (from the past 240 hour(s))  Blood Culture (routine x 2)     Status: None (Preliminary result)   Collection Time: 02/20/18  8:22 PM  Result Value Ref Range Status   Specimen Description   Final    BLOOD LEFT ANTECUBITAL Performed at [facility], [address], [place], [place] [address]    Special Requests   Final    BOTTLES DRAWN AEROBIC ONLY Blood Culture results may not be optimal due to an inadequate volume of blood received in culture bottles Performed at [facility], [address], [place], [place] [address]    Culture   Final    NO GROWTH < 12 HOURS Performed at  [facility], [address] [place], [place] [address]    Report Status PENDING  Incomplete  Blood Culture (routine x 2)     Status: None (Preliminary result)   Collection Time: 02/20/18  8:49 PM  Result Value Ref Range Status   Specimen Description   Final    BLOOD RIGHT ANTECUBITAL Performed at [facility], [address], [place], [place] [address]    Special Requests   Final    BOTTLES DRAWN AEROBIC AND ANAEROBIC Blood Culture adequate volume Performed at  [facility], [address] [place], [place] [address]    Culture  Setup Time   Final    GRAM NEGATIVE RODS ANAEROBIC BOTTLE ONLY Organism ID to follow Performed at [facility], [address], [place], [place] [address]    Culture GRAM NEGATIVE RODS  Final   Report Status PENDING  Incomplete     Labs: BNP (last 3 results) No results for input(s): BNP in the last 8760 hours. Basic Metabolic Panel: Recent Labs  Lab 02/20/18 1338 02/21/18 0449  NA 133* 137  K 3.5 4.2  CL 92* 99*  CO2 27 25  GLUCOSE 133* 99  BUN 11 18  CREATININE 1.10* 1.35*  CALCIUM 9.5 8.6*  MG  --  2.3  PHOS  --  3.5   Liver Function Tests: No results for input(s): AST, ALT, ALKPHOS, BILITOT, PROT, ALBUMIN in the last 168 hours. No results for input(s): LIPASE, AMYLASE in the last 168 hours. No results for input(s): AMMONIA in the last 168 hours. CBC: Recent Labs  Lab 02/20/18 1338  WBC 17.5*  HGB 14.4  HCT 43.1  MCV 83.4  PLT 138*   Cardiac Enzymes: No results for input(s): CKTOTAL, CKMB, CKMBINDEX, TROPONINI in the last 168 hours. BNP: Invalid input(s): POCBNP CBG: No results for input(s): GLUCAP in the last 168 hours. D-Dimer No results for input(s): DDIMER in the last 72 hours. Hgb A1c No results for input(s): HGBA1C in the last 72 hours. Lipid Profile No results for input(s): CHOL, HDL, LDLCALC, TRIG, CHOLHDL, LDLDIRECT in the last 72 hours. Thyroid function studies No results for input(s): TSH, T4TOTAL, T3FREE, THYROIDAB in the last 72 hours.  Invalid input(s): FREET3 Anemia work up No results for input(s): VITAMINB12, FOLATE, FERRITIN, TIBC, IRON, RETICCTPCT in the last 72 hours. Urinalysis    Component Value Date/Time   COLORURINE AMBER (A) 02/20/2018 2049   APPEARANCEUR CLOUDY (A) 02/20/2018 2049   LABSPEC 1.012 02/20/2018 2049   PHURINE 5.0 02/20/2018 2049   GLUCOSEU NEGATIVE 02/20/2018 2049   HGBUR MODERATE (A) 02/20/2018 2049   BILIRUBINUR  NEGATIVE 02/20/2018 2049   BILIRUBINUR neg 08/20/2015 0927   KETONESUR NEGATIVE 02/20/2018 2049   PROTEINUR 100 (A) 02/20/2018 2049   UROBILINOGEN negative 08/20/2015 0927   UROBILINOGEN 0.2 08/17/2015 1245   NITRITE NEGATIVE 02/20/2018 2049   LEUKOCYTESUR LARGE (A) 02/20/2018 2049   Sepsis Labs Invalid input(s): PROCALCITONIN,  WBC,  LACTICIDVEN Microbiology Recent Results (from the past 240 hour(s))  Blood Culture (routine x 2)     Status: None (Preliminary result)   Collection Time: 02/20/18  8:22 PM  Result Value Ref Range Status   Specimen Description   Final    BLOOD LEFT ANTECUBITAL Performed at [facility], [address], [place], [place] [address]    Special Requests   Final    BOTTLES DRAWN AEROBIC ONLY Blood Culture results may not be optimal due to  an inadequate volume of blood received in culture bottles Performed at [facility], [address] [place], [place] [address]    Culture   Final    NO GROWTH < 12 HOURS Performed at [facility], [address] [place], [place] [address]    Report Status PENDING  Incomplete  Blood Culture (routine x 2)     Status: None (Preliminary result)   Collection Time: 02/20/18  8:49 PM  Result Value Ref Range Status   Specimen Description   Final    BLOOD RIGHT ANTECUBITAL Performed at [facility], [address] [place], [place] [address]    Special Requests   Final    BOTTLES DRAWN AEROBIC AND ANAEROBIC Blood Culture adequate volume Performed at [facility], [address] [place], [place] [address]    Culture  Setup Time   Final    GRAM NEGATIVE RODS ANAEROBIC BOTTLE ONLY Organism ID to follow Performed at [facility], [address] [place], [place] [address]    Culture GRAM NEGATIVE RODS  Final   Report Status PENDING  Incomplete       SIGNED:   Coralie KeensMauricio Daniel Elley Harp, MD  [facility] 02/21/2018, 1:11  PM Pager [phone]  If 7PM-7AM, please contact night-coverage [email or web address] Password [email or web address]

## 2018-02-21 NOTE — Discharge Instructions (Signed)
Please take the entire course of antibiotics, even if you are feeling better.  Please drink plenty of fluids to stay well-hydrated now help clear your infection.

## 2018-02-21 NOTE — ED Triage Notes (Addendum)
Patient ambulatory to triage with steady gait, without difficulty or distress noted; pt reports since Saturday having bilat flank pain; [facility] and dx with kidney infection; st felt neglected, left AMA and was given rx cipro

## 2018-02-21 NOTE — ED Notes (Signed)
Meal tray given to patient.

## 2018-02-21 NOTE — ED Notes (Signed)
Patient informed by MD that he was sending prescriptions to designated pharmacy. Patient spoke with MD and RN regarding leaving AMA and risks associated. Patient left in NAD with significant other.

## 2018-02-22 LAB — URINE CULTURE

## 2018-02-23 LAB — CULTURE, BLOOD (ROUTINE X 2): SPECIAL REQUESTS: ADEQUATE

## 2018-02-24 ENCOUNTER — Telehealth: Payer: Self-pay

## 2018-02-24 NOTE — Progress Notes (Signed)
ED Antimicrobial Stewardship Positive Culture Follow Up   Katelyn [name] is an 32 y.o. female who presented to [facility] on 02/21/2018 with a chief complaint of ongoing lower back pain. Patient was [facility] and left [facility]MA. Returned to ED with worsening symptoms.  Chief Complaint  Patient presents with  . Flank Pain    Recent Results (from the past 720 hour(s))  Blood Culture (routine x 2)     Status: None (Preliminary result)   Collection Time: 02/20/18  8:22 PM  Result Value Ref Range Status   Specimen Description   Final    BLOOD LEFT ANTECUBITAL Performed at [facility], [address] [place], [place] [address]    Special Requests   Final    BOTTLES DRAWN AEROBIC ONLY Blood Culture results may not be optimal due to an inadequate volume of blood received in culture bottles Performed at [facility], [address] [place], [place] [address]    Culture   Final    NO GROWTH 3 DAYS Performed at [facility], [address] [place], [place] [address]    Report Status PENDING  Incomplete  Blood Culture (routine x 2)     Status: Abnormal   Collection Time: 02/20/18  8:49 PM  Result Value Ref Range Status   Specimen Description   Final    BLOOD RIGHT ANTECUBITAL Performed at [facility], [address] [place], [place] [address]    Special Requests   Final    BOTTLES DRAWN AEROBIC AND ANAEROBIC Blood Culture adequate volume Performed at [facility], [address] [place], [place] [address]    Culture  Setup Time   Final    GRAM NEGATIVE RODS IN BOTH AEROBIC AND ANAEROBIC BOTTLES CRITICAL RESULT CALLED TO, READ BACK BY AND VERIFIED WITH: M. Arrien MD 14:30 02/21/18 ([email or web address]) Performed at [facility], [address] [place], [place] [address]    Culture ESCHERICHIA COLI (A)  Final   Report Status 02/23/2018 FINAL  Final   Organism ID, Bacteria  ESCHERICHIA COLI  Final      Susceptibility   Escherichia coli - MIC*    AMPICILLIN >=32 RESISTANT Resistant     CEFAZOLIN <=4 SENSITIVE Sensitive     CEFEPIME <=1 SENSITIVE Sensitive     CEFTAZIDIME <=1 SENSITIVE Sensitive     CEFTRIAXONE <=1 SENSITIVE Sensitive     CIPROFLOXACIN <=0.25 SENSITIVE Sensitive     GENTAMICIN <=1 SENSITIVE Sensitive     IMIPENEM <=0.25 SENSITIVE Sensitive     TRIMETH/SULFA <=20 SENSITIVE Sensitive     AMPICILLIN/SULBACTAM >=32 RESISTANT Resistant     PIP/TAZO <=4 SENSITIVE Sensitive     Extended ESBL NEGATIVE Sensitive     * ESCHERICHIA COLI  Culture, Urine     Status: Abnormal   Collection Time: 02/20/18  8:49 PM  Result Value Ref Range Status   Specimen Description   Final    URINE, CLEAN CATCH Performed at [facility], [address] [place], [place] [address]    Special Requests   Final    NONE Performed at [facility], [address] [place], [place] [address]    Culture MULTIPLE SPECIES PRESENT, SUGGEST RECOLLECTION (A)  Final   Report Status 02/22/2018 FINAL  Final  Blood Culture ID Panel (Reflexed)     Status: Abnormal   Collection Time: 02/20/18  8:49 PM  Result Value Ref  Range Status   Enterococcus species NOT DETECTED NOT DETECTED Final   Listeria monocytogenes NOT DETECTED NOT DETECTED Final   Staphylococcus species NOT DETECTED NOT DETECTED Final   Staphylococcus aureus NOT DETECTED NOT DETECTED Final   Streptococcus species NOT DETECTED NOT DETECTED Final   Streptococcus agalactiae NOT DETECTED NOT DETECTED Final   Streptococcus pneumoniae NOT DETECTED NOT DETECTED Final   Streptococcus pyogenes NOT DETECTED NOT DETECTED Final   Acinetobacter baumannii NOT DETECTED NOT DETECTED Final   Enterobacteriaceae species DETECTED (A) NOT DETECTED Final    Comment: Enterobacteriaceae represent a large family of gram-negative bacteria, not a single organism. CRITICAL RESULT CALLED TO, READ BACK BY  AND VERIFIED WITH: M. Arrien MD 14:30 02/21/18 ([email or web address])    Enterobacter cloacae complex NOT DETECTED NOT DETECTED Final   Escherichia coli DETECTED (A) NOT DETECTED Final    Comment: CRITICAL RESULT CALLED TO, READ BACK BY AND VERIFIED WITH: M. Arrien MD 14:30 02/21/18 ([email or web address])    Klebsiella oxytoca NOT DETECTED NOT DETECTED Final   Klebsiella pneumoniae NOT DETECTED NOT DETECTED Final   Proteus species NOT DETECTED NOT DETECTED Final   Serratia marcescens NOT DETECTED NOT DETECTED Final   Carbapenem resistance NOT DETECTED NOT DETECTED Final   Haemophilus influenzae NOT DETECTED NOT DETECTED Final   Neisseria meningitidis NOT DETECTED NOT DETECTED Final   Pseudomonas aeruginosa NOT DETECTED NOT DETECTED Final   Candida albicans NOT DETECTED NOT DETECTED Final   Candida glabrata NOT DETECTED NOT DETECTED Final   Candida krusei NOT DETECTED NOT DETECTED Final   Candida parapsilosis NOT DETECTED NOT DETECTED Final   Candida tropicalis NOT DETECTED NOT DETECTED Final    Patient's blood cultures grew out 1/2 E.coli with urine being the likely source. Discussed with APP. She is on appropriate antibiotics for UTI and bacteremia. Asked patient placement to call patient and inquire if symptoms have improved and to ensure patient has picked up antibiotics and is compliant. If feeling better, no further action necessary. If patient seems worse, will instruct her to return to ED.   New antibiotic prescription: None   ED Provider: [place] Robinson, PA-C   Vinnie Level, PharmD., BCPS Clinical Pharmacist

## 2018-02-24 NOTE — Telephone Encounter (Signed)
Post ED Visit - Positive Culture Follow-up: Unsuccessful Patient Follow-up  Culture assessed and recommendations reviewed by:  []  Enzo BiNathan Batchelder, Pharm.D. []  Celedonio MiyamotoJeremy Frens, Pharm.D., BCPS AQ-ID []  Garvin FilaMike Maccia, Pharm.D., BCPS []  Georgina PillionElizabeth Martin, [address] BCPS []  MelbourneMinh Pham, [address] BCPS, AAHIVP []  Estella HuskMichelle Turner, Pharm.D., BCPS, AAHIVP []  Lysle Pearlachel Rumbarger, PharmD, BCPS []  Casilda Carlsaylor Stone, PharmD, BCPS []  Pollyann SamplesAndy Johnston, PharmD, BCPS Ben Mancheril Pharm D Positive [facility] Called for symptom check and to make sure pt is taking abx  No phone number []  Patient discharged without antimicrobial prescription and treatment is now indicated []  Organism is resistant to prescribed ED discharge antimicrobial []  Patient with positive blood cultures   Unable to contact patient after 3 attempts, letter will be sent to address on file  Jerry CarasCullom, Tychelle Purkey Burnett 02/24/2018, 10:31 AM

## 2018-02-25 LAB — CULTURE, BLOOD (ROUTINE X 2): Culture: NO GROWTH

## 2018-03-02 ENCOUNTER — Telehealth: Payer: Self-pay | Admitting: *Deleted

## 2018-03-02 NOTE — Telephone Encounter (Signed)
Received callback from patient in response to letter sent to address on file for (+) blood cultures.  States she has also been seen at [facility].  States she did get prescription for Cipro and is currently still taking as prescribed.  States that she does not really feel better.  Advised patient that if symptoms are persisting she can come to ER for additional evaluation and she may require admission/IV antibiotics for treatment.

## 2018-03-03 ENCOUNTER — Encounter: Payer: Self-pay | Admitting: Emergency Medicine

## 2018-03-03 DIAGNOSIS — R109 Unspecified abdominal pain: Secondary | ICD-10-CM | POA: Diagnosis present

## 2018-03-03 DIAGNOSIS — Z79899 Other long term (current) drug therapy: Secondary | ICD-10-CM | POA: Insufficient documentation

## 2018-03-03 DIAGNOSIS — R112 Nausea with vomiting, unspecified: Secondary | ICD-10-CM | POA: Diagnosis not present

## 2018-03-03 DIAGNOSIS — F17210 Nicotine dependence, cigarettes, uncomplicated: Secondary | ICD-10-CM | POA: Diagnosis not present

## 2018-03-03 DIAGNOSIS — R1084 Generalized abdominal pain: Secondary | ICD-10-CM | POA: Diagnosis not present

## 2018-03-03 DIAGNOSIS — F329 Major depressive disorder, single episode, unspecified: Secondary | ICD-10-CM | POA: Diagnosis not present

## 2018-03-03 DIAGNOSIS — F909 Attention-deficit hyperactivity disorder, unspecified type: Secondary | ICD-10-CM | POA: Diagnosis not present

## 2018-03-03 DIAGNOSIS — F419 Anxiety disorder, unspecified: Secondary | ICD-10-CM | POA: Insufficient documentation

## 2018-03-03 LAB — COMPREHENSIVE METABOLIC PANEL
ALK PHOS: 88 U/L (ref 38–126)
ALT: 17 U/L (ref 14–54)
AST: 21 U/L (ref 15–41)
Albumin: 4.1 g/dL (ref 3.5–5.0)
Anion gap: 10 (ref 5–15)
BUN: 17 mg/dL (ref 6–20)
CO2: 29 mmol/L (ref 22–32)
CREATININE: 1.01 mg/dL — AB (ref 0.44–1.00)
Calcium: 9.6 mg/dL (ref 8.9–10.3)
Chloride: 101 mmol/L (ref 101–111)
Glucose, Bld: 87 mg/dL (ref 65–99)
Potassium: 4.5 mmol/L (ref 3.5–5.1)
Sodium: 140 mmol/L (ref 135–145)
TOTAL PROTEIN: 7.9 g/dL (ref 6.5–8.1)
Total Bilirubin: 0.3 mg/dL (ref 0.3–1.2)

## 2018-03-03 LAB — URINALYSIS, COMPLETE (UACMP) WITH MICROSCOPIC
Bacteria, UA: NONE SEEN
Bilirubin Urine: NEGATIVE
GLUCOSE, UA: NEGATIVE mg/dL
HGB URINE DIPSTICK: NEGATIVE
KETONES UR: NEGATIVE mg/dL
Leukocytes, UA: NEGATIVE
NITRITE: NEGATIVE
PH: 6 (ref 5.0–8.0)
Protein, ur: NEGATIVE mg/dL
Specific Gravity, Urine: 1.018 (ref 1.005–1.030)

## 2018-03-03 LAB — CBC
HCT: 34.9 % — ABNORMAL LOW (ref 35.0–47.0)
HEMOGLOBIN: 11.4 g/dL — AB (ref 12.0–16.0)
MCH: 26.8 pg (ref 26.0–34.0)
MCHC: 32.8 g/dL (ref 32.0–36.0)
MCV: 81.8 fL (ref 80.0–100.0)
PLATELETS: 440 10*3/uL (ref 150–440)
RBC: 4.27 MIL/uL (ref 3.80–5.20)
RDW: 14.7 % — ABNORMAL HIGH (ref 11.5–14.5)
WBC: 9.4 10*3/uL (ref 3.6–11.0)

## 2018-03-03 LAB — LIPASE, BLOOD: Lipase: 36 U/L (ref 11–51)

## 2018-03-03 MED ORDER — ONDANSETRON 4 MG PO TBDP
4.0000 mg | ORAL_TABLET | Freq: Once | ORAL | Status: AC
  Administered 2018-03-03: 4 mg via ORAL
  Filled 2018-03-03: qty 1

## 2018-03-03 NOTE — ED Triage Notes (Signed)
Patient states that she was recently admitted to the hospital for kidney infection. Patient states that she left AMA from the hospital. Patient states that she received a phone call stating that she had E. Coli in his urine. Patient states that she has had lower abdominal pain radiating to her lower back times two weeks. Patient states that the pain became worse today. Patient states that she has also had nausea and vomiting.

## 2018-03-04 ENCOUNTER — Emergency Department
Admission: EM | Admit: 2018-03-04 | Discharge: 2018-03-04 | Disposition: A | Discharge status: Home / Self Care | Payer: Medicaid Other | Attending: Emergency Medicine | Admitting: Emergency Medicine

## 2018-03-04 DIAGNOSIS — R1084 Generalized abdominal pain: Secondary | ICD-10-CM

## 2018-03-04 DIAGNOSIS — R112 Nausea with vomiting, unspecified: Secondary | ICD-10-CM

## 2018-03-04 MED ORDER — CEFTRIAXONE SODIUM 1 G IJ SOLR
INTRAMUSCULAR | Status: AC
  Administered 2018-03-04: 1 g via INTRAMUSCULAR
  Filled 2018-03-04: qty 10

## 2018-03-04 MED ORDER — TRAMADOL HCL 50 MG PO TABS: 50.0000 mg | ORAL_TABLET | Freq: Four times a day (QID) | ORAL | 0 refills | Status: AC | PRN

## 2018-03-04 MED ORDER — ONDANSETRON 4 MG PO TBDP: 4.0000 mg | ORAL_TABLET | Freq: Once | ORAL | Status: DC

## 2018-03-04 MED ORDER — LIDOCAINE HCL (PF) 1 % IJ SOLN
INTRAMUSCULAR | Status: AC
  Filled 2018-03-04: qty 5

## 2018-03-04 MED ORDER — CEFTRIAXONE SODIUM 1 G IJ SOLR
1.0000 g | Freq: Once | INTRAMUSCULAR | Status: AC
  Administered 2018-03-04: 1 g via INTRAMUSCULAR

## 2018-03-04 MED ORDER — TRAMADOL HCL 50 MG PO TABS: 50.0000 mg | ORAL_TABLET | Freq: Once | ORAL | Status: DC

## 2018-03-04 MED ORDER — ONDANSETRON 4 MG PO TBDP: 4.0000 mg | ORAL_TABLET | Freq: Three times a day (TID) | ORAL | 0 refills | Status: AC | PRN

## 2018-03-04 MED ORDER — KETOROLAC TROMETHAMINE 30 MG/ML IJ SOLN
INTRAMUSCULAR | Status: AC
  Administered 2018-03-04: 30 mg via INTRAMUSCULAR
  Filled 2018-03-04: qty 1

## 2018-03-04 MED ORDER — KETOROLAC TROMETHAMINE 30 MG/ML IJ SOLN
30.0000 mg | Freq: Once | INTRAMUSCULAR | Status: AC
  Administered 2018-03-04: 30 mg via INTRAMUSCULAR
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
## 2018-03-04 NOTE — ED Notes (Signed)
See triage note.  Pt with lower back and abd pain worsening over today.  States n/v.  No acute distress noted upon assessment.

## 2020-03-27 DIAGNOSIS — Z23 Encounter for immunization: Secondary | ICD-10-CM | POA: Diagnosis not present

## 2020-04-24 DIAGNOSIS — Z23 Encounter for immunization: Secondary | ICD-10-CM | POA: Diagnosis not present

## 2020-07-10 DIAGNOSIS — R509 Fever, unspecified: Secondary | ICD-10-CM | POA: Diagnosis not present

## 2020-07-10 DIAGNOSIS — Z20822 Contact with and (suspected) exposure to covid-19: Secondary | ICD-10-CM | POA: Diagnosis not present

## 2020-07-10 DIAGNOSIS — R0981 Nasal congestion: Secondary | ICD-10-CM | POA: Diagnosis not present

## 2020-07-10 DIAGNOSIS — R05 Cough: Secondary | ICD-10-CM | POA: Diagnosis not present

## 2020-07-10 DIAGNOSIS — R519 Headache, unspecified: Secondary | ICD-10-CM | POA: Diagnosis not present

## 2020-07-10 DIAGNOSIS — R0602 Shortness of breath: Secondary | ICD-10-CM | POA: Diagnosis not present

## 2020-07-10 DIAGNOSIS — R52 Pain, unspecified: Secondary | ICD-10-CM | POA: Diagnosis not present

## 2023-01-01 ENCOUNTER — Other Ambulatory Visit: Payer: Self-pay

## 2023-01-01 ENCOUNTER — Ambulatory Visit (HOSPITAL_COMMUNITY)
Admission: EM | Admit: 2023-01-01 | Discharge: 2023-01-01 | Disposition: A | Discharge status: Home / Self Care | Payer: Medicaid Other | Attending: Internal Medicine | Admitting: Internal Medicine

## 2023-01-01 ENCOUNTER — Encounter (HOSPITAL_COMMUNITY): Payer: Self-pay | Admitting: *Deleted

## 2023-01-01 DIAGNOSIS — B9689 Other specified bacterial agents as the cause of diseases classified elsewhere: Secondary | ICD-10-CM | POA: Diagnosis not present

## 2023-01-01 DIAGNOSIS — R053 Chronic cough: Secondary | ICD-10-CM

## 2023-01-01 DIAGNOSIS — J019 Acute sinusitis, unspecified: Secondary | ICD-10-CM | POA: Diagnosis not present

## 2023-01-01 MED ORDER — PROMETHAZINE-DM 6.25-15 MG/5ML PO SYRP: 5.0000 mL | ORAL_SOLUTION | Freq: Every evening | ORAL | 0 refills | Status: AC | PRN

## 2023-01-01 MED ORDER — BENZONATATE 100 MG PO CAPS: 100.0000 mg | ORAL_CAPSULE | Freq: Three times a day (TID) | ORAL | 0 refills | Status: AC

## 2023-01-01 MED ORDER — DOXYCYCLINE HYCLATE 100 MG PO CAPS: 100.0000 mg | ORAL_CAPSULE | Freq: Two times a day (BID) | ORAL | 0 refills | Status: AC

## 2023-01-01 NOTE — Discharge Instructions (Addendum)
Your evaluation shows you have a bacterial sinus infection plus a viral infection of the upper airways of your lungs. Use the following medicines to help with your symptoms:  - Take antibiotic sent to pharmacy as directed to treat sinus infection. - Flonase over the counter may be used as needed for congestion as directed. - Tessalon perles every 8 hours as needed for cough. - Take Promethazine DM cough medication to help with your cough at nighttime so that you are able to sleep. Do not drive, drink alcohol, or go to work while taking this medication since it can make you sleepy. Only take this at nighttime.  - Purchase Mucinex over the counter and take this every 12 hours as needed for nasal congestion and cough.  If you develop any new or worsening symptoms or do not improve in the next 2 to 3 days, please return.  If your symptoms are severe, please go to the emergency room.  Follow-up with your primary care provider for further evaluation and management of your symptoms as well as ongoing wellness visits.  I hope you feel better!

## 2023-01-01 NOTE — ED Triage Notes (Signed)
Pt reports cough for 3 weeks with nose bleeds .
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
